# Patient Record
Sex: Male | Born: 1995 | Race: Black or African American | Hispanic: No | Marital: Single | Smoking: Never smoker
Health system: Southern US, Community
[De-identification: ages and names within clinical notes are randomized; demographics above are authoritative.]

---
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
## 2016-01-15 ENCOUNTER — Ambulatory Visit: Payer: Self-pay | Admitting: Family Medicine

## 2016-02-19 ENCOUNTER — Ambulatory Visit: Payer: Self-pay | Admitting: Family Medicine

## 2017-02-11 ENCOUNTER — Encounter: Payer: Self-pay | Admitting: Family Medicine

## 2017-02-11 ENCOUNTER — Ambulatory Visit (INDEPENDENT_AMBULATORY_CARE_PROVIDER_SITE_OTHER): Payer: Managed Care, Other (non HMO) | Admitting: Family Medicine

## 2017-02-11 VITALS — BP 100/70 | HR 78 | Ht 67.0 in | Wt 121.0 lb

## 2017-02-11 DIAGNOSIS — L02413 Cutaneous abscess of right upper limb: Secondary | ICD-10-CM

## 2017-02-11 NOTE — Progress Notes (Signed)
   Subjective:  Robert Hayden is a 21 y.o. male who presents today with a chief complaint of elbow pain and to establish care.   HPI:  Elbow Pain, New problem Symptoms started about 3 weeks ago on his right posterior elbow. Worse with movement. Some swelling and drainage. Started on doxycycline which did not seem to help very much. Doxycycline made him sick to his stomach. Stopped after 5 days. Symptoms are about the same. No fevers or chills  ROS: Per HPI, otherwise a 14 point review of systems was performed and was negative.  PMH:  The following were reviewed and entered/updated in epic: History reviewed. No pertinent past medical history. There are no active problems to display for this patient.  History reviewed. No pertinent surgical history.  History reviewed. No pertinent family history.  Medications- reviewed and updated Current Outpatient Prescriptions  Medication Sig Dispense Refill  . doxycycline (ADOXA) 100 MG tablet Take 100 mg by mouth 2 (two) times daily.     No current facility-administered medications for this visit.     Allergies-reviewed and updated No Known Allergies  Social History   Social History  . Marital status: Single    Spouse name: N/A  . Number of children: N/A  . Years of education: N/A   Social History Main Topics  . Smoking status: Never Smoker  . Smokeless tobacco: Never Used  . Alcohol use No  . Drug use: No  . Sexual activity: Not Asked   Other Topics Concern  . None   Social History Narrative  . None   Objective:  Physical Exam: BP 100/70   Pulse 78   Ht 5\' 7"  (1.702 m)   Wt 121 lb (54.9 kg)   SpO2 99%   BMI 18.95 kg/m   Gen: NAD, resting comfortably CV: RRR with no murmurs appreciated Pulm: NWOB, CTAB with no crackles, wheezes, or rhonchi GI: Normal bowel sounds present. Soft, Nontender, Nondistended. MSK: No edema, cyanosis, or clubbing noted -Rt Elbow: Small well healing scars. No edema or erythema. No areas of  fluctuance.  Skin: Warm, dry Neuro: Grossly normal, moves all extremities Psych: Normal affect and thought content  Assessment/Plan:  Cutaneous Abscess Well healing. No signs of residual infection. Recommended tylenol and/or ibuprofen as needed for pain. Return precautions reviewed.  Preventative Healthcare Flu shot deferred. Will return for CPE.   Katina Degreealeb M. Jimmey RalphParker, MD 02/11/2017 9:53 AM

## 2017-02-11 NOTE — Patient Instructions (Signed)
The spot on your arm is healing normally. You do not need further treatment at this time.   Preventive Care 18-39 Years, Male Preventive care refers to lifestyle choices and visits with your health care provider that can promote health and wellness. What does preventive care include?  A yearly physical exam. This is also called an annual well check.  Dental exams once or twice a year.  Routine eye exams. Ask your health care provider how often you should have your eyes checked.  Personal lifestyle choices, including: ? Daily care of your teeth and gums. ? Regular physical activity. ? Eating a healthy diet. ? Avoiding tobacco and drug use. ? Limiting alcohol use. ? Practicing safe sex. What happens during an annual well check? The services and screenings done by your health care provider during your annual well check will depend on your age, overall health, lifestyle risk factors, and family history of disease. Counseling Your health care provider may ask you questions about your:  Alcohol use.  Tobacco use.  Drug use.  Emotional well-being.  Home and relationship well-being.  Sexual activity.  Eating habits.  Work and work Statistician.  Screening You may have the following tests or measurements:  Height, weight, and BMI.  Blood pressure.  Lipid and cholesterol levels. These may be checked every 5 years starting at age 29.  Diabetes screening. This is done by checking your blood sugar (glucose) after you have not eaten for a while (fasting).  Skin check.  Hepatitis C blood test.  Hepatitis B blood test.  Sexually transmitted disease (STD) testing.  Discuss your test results, treatment options, and if necessary, the need for more tests with your health care provider. Vaccines Your health care provider may recommend certain vaccines, such as:  Influenza vaccine. This is recommended every year.  Tetanus, diphtheria, and acellular pertussis (Tdap, Td)  vaccine. You may need a Td booster every 10 years.  Varicella vaccine. You may need this if you have not been vaccinated.  HPV vaccine. If you are 49 or younger, you may need three doses over 6 months.  Measles, mumps, and rubella (MMR) vaccine. You may need at least one dose of MMR.You may also need a second dose.  Pneumococcal 13-valent conjugate (PCV13) vaccine. You may need this if you have certain conditions and have not been vaccinated.  Pneumococcal polysaccharide (PPSV23) vaccine. You may need one or two doses if you smoke cigarettes or if you have certain conditions.  Meningococcal vaccine. One dose is recommended if you are age 39-21 years and a first-year college student living in a residence hall, or if you have one of several medical conditions. You may also need additional booster doses.  Hepatitis A vaccine. You may need this if you have certain conditions or if you travel or work in places where you may be exposed to hepatitis A.  Hepatitis B vaccine. You may need this if you have certain conditions or if you travel or work in places where you may be exposed to hepatitis B.  Haemophilus influenzae type b (Hib) vaccine. You may need this if you have certain risk factors.  Talk to your health care provider about which screenings and vaccines you need and how often you need them. This information is not intended to replace advice given to you by your health care provider. Make sure you discuss any questions you have with your health care provider. Document Released: 06/04/2001 Document Revised: 12/27/2015 Document Reviewed: 02/07/2015 Elsevier Interactive Patient Education  2017 [employer].

## 2017-07-07 ENCOUNTER — Ambulatory Visit: Payer: Managed Care, Other (non HMO) | Admitting: Family Medicine

## 2017-07-08 ENCOUNTER — Ambulatory Visit: Payer: Managed Care, Other (non HMO) | Admitting: Family Medicine

## 2017-07-09 ENCOUNTER — Encounter: Payer: Self-pay | Admitting: Family Medicine

## 2017-07-09 ENCOUNTER — Ambulatory Visit (INDEPENDENT_AMBULATORY_CARE_PROVIDER_SITE_OTHER): Payer: Managed Care, Other (non HMO) | Admitting: Family Medicine

## 2017-07-09 VITALS — BP 122/68 | HR 80 | Temp 97.5°F | Ht 67.0 in | Wt 123.4 lb

## 2017-07-09 DIAGNOSIS — R51 Headache: Secondary | ICD-10-CM

## 2017-07-09 DIAGNOSIS — R519 Headache, unspecified: Secondary | ICD-10-CM

## 2017-07-09 MED ORDER — SUMATRIPTAN SUCCINATE 25 MG PO TABS: 25.0000 mg | ORAL_TABLET | ORAL | 0 refills | Status: DC | PRN

## 2017-07-09 NOTE — Patient Instructions (Signed)
Please try the imitrex.  If this does not improve your headaches, please let me know.   Please let me know if your symptoms worsen or are not improving.  We may need to get a scan of your head to make sure there is nothing else going on.   Please keep a headache diary.  Come back to see me in 1-2 months or sooner as needed.  Take care, Dr Jimmey RalphParker

## 2017-07-09 NOTE — Progress Notes (Signed)
    Subjective:  Robert Hayden is a 22 y.o. male who presents today for same-day appointment with a chief complaint of headahces.   HPI:  Headaches, Acute Issue Headache started about 2 weeks ago.  Have been stable over the past week or so.  He does not have headaches every day however has the most days of the week.  Pain can be located all over his head.  Usually on one side or the other.  During the headaches has associated photophobia and dizziness.  No weakness or numbness.  No nausea or vomiting.  No obvious precipitating events or aggravating factors, however will sometimes have a headache upon waking up.  He will take Tylenol which usually helps with his headache.  Pain is not worse with cough, sneeze, or bending over.  Headaches are at about a 7-8 at its worst.  No history of head trauma  ROS: Per HPI  PMH: He reports that  has never smoked. he has never used smokeless tobacco. He reports that he does not drink alcohol or use drugs.  Objective:  Physical Exam: BP 122/68 (BP Location: Left Arm, Patient Position: Sitting, Cuff Size: Normal)   Pulse 80   Temp (!) 97.5 F (36.4 C) (Oral)   Ht 5\' 7"  (1.702 m)   Wt 123 lb 6.4 oz (56 kg)   SpO2 97%   BMI 19.33 kg/m   Gen: NAD, resting comfortably CV: RRR with no murmurs appreciated Pulm: NWOB, CTAB with no crackles, wheezes, or rhonchi GI: Normal bowel sounds present. Soft, Nontender, Nondistended. Skin: Warm, dry Neuro: Cranial nerves III through XII intact.  Strength 5 out of 5 in upper and lower extremities.  Sensation to light touch intact throughout.  Reflexes 2+ and symmetric bilaterally at biceps and patella. Psych: Normal affect and thought content  Assessment/Plan:  Headaches Normal neurological exam today.  He is having a little bit of blurred vision, photophobia, and dizziness during his headaches which could be consistent with migraine type aura.  Does not have any other red flag signs or symptoms on today's exam or  history.  Discussed obtaining brain imaging to rule out mass or other intracranial causes today, however patient deferred.  Given his normal neurological exam and lack of red flag signs/symptoms, it is low given his migraine type symptoms, we will empirically treat with sumatriptan.  Advised patient to let us know if symptoms are not improving with sumatriptan.  If no improvement, will likely need brain imaging.  Discussed strict reasons to return to care including worsening of headaches, development of any weakness, numbness, speech difficulty, nausea, vomiting, or syncopal episodes.  Patient will follow-up with me in 3-4 weeks.  Advised him to keep a headache diary to look for any specific triggers.  He will also be seeing an eye doctor to have his vision checked.  Katina Degreealeb M. Jimmey RalphParker, MD 07/09/2017 10:21 AM

## 2017-10-28 ENCOUNTER — Ambulatory Visit (INDEPENDENT_AMBULATORY_CARE_PROVIDER_SITE_OTHER): Payer: Managed Care, Other (non HMO) | Admitting: Family Medicine

## 2017-10-28 ENCOUNTER — Encounter: Payer: Self-pay | Admitting: Family Medicine

## 2017-10-28 VITALS — BP 118/64 | HR 70 | Temp 98.5°F | Ht 67.0 in | Wt 119.2 lb

## 2017-10-28 DIAGNOSIS — H6693 Otitis media, unspecified, bilateral: Secondary | ICD-10-CM | POA: Diagnosis not present

## 2017-10-28 MED ORDER — AMOXICILLIN 875 MG PO TABS: 875.0000 mg | ORAL_TABLET | Freq: Two times a day (BID) | ORAL | 0 refills | Status: DC

## 2017-10-28 MED ORDER — KETOROLAC TROMETHAMINE 60 MG/2ML IM SOLN
60.0000 mg | Freq: Once | INTRAMUSCULAR | Status: AC
  Administered 2017-10-28: 60 mg via INTRAMUSCULAR

## 2017-10-28 NOTE — Patient Instructions (Addendum)
It was very nice to see you today!  You have an ear infection. Please start the antibiotic.  We will give you an injection of a pain medication today called Toradol.  This will relieve your inflammation.  Starting tomorrow, you can take Motrin or naproxen as needed.  Please let me know if your symptoms worsen or do not improve over the next few days.  Take care, Dr Jimmey RalphParker   Otitis Media, Adult Otitis media is redness, soreness, and puffiness (swelling) in the space just behind your eardrum (middle ear). It may be caused by allergies or infection. It often happens along with a cold. Follow these instructions at home:  Take your medicine as told. Finish it even if you start to feel better.  Only take over-the-counter or prescription medicines for pain, discomfort, or fever as told by your doctor.  Follow up with your doctor as told. Contact a doctor if:  You have otitis media only in one ear, or bleeding from your nose, or both.  You notice a lump on your neck.  You are not getting better in 3-5 days.  You feel worse instead of better. Get help right away if:  You have pain that is not helped with medicine.  You have puffiness, redness, or pain around your ear.  You get a stiff neck.  You cannot move part of your face (paralysis).  You notice that the bone behind your ear hurts when you touch it. This information is not intended to replace advice given to you by your health care provider. Make sure you discuss any questions you have with your health care provider. Document Released: 09/25/2007 Document Revised: 09/14/2015 Document Reviewed: 11/03/2012 Elsevier Interactive Patient Education  2017 [employer]

## 2017-10-28 NOTE — Progress Notes (Signed)
   Subjective:  Robert Hayden is a 22 y.o. male who presents today for same-day appointment with a chief complaint of ear pain.   HPI:  Ear Pain, Acute problem Started about a week ago.  Initially associated with some rhinorrhea and sore throat which has since improved.  He recently went to [place].  Symptoms have worsened over the last couple of days.  He saw a physician in [place] and was given a medication, but does not remember the name of the medication.  No obvious alleviating or aggravating factors.  ROS: Per HPI  PMH: He reports that he has never smoked. He has never used smokeless tobacco. He reports that he does not drink alcohol or use drugs.  Objective:  Physical Exam: BP 118/64 (BP Location: Left Arm, Patient Position: Sitting, Cuff Size: Normal)   Pulse 70   Temp 98.5 F (36.9 C) (Oral)   Ht 5\' 7"  (1.702 m)   Wt 119 lb 3.2 oz (54.1 kg)   SpO2 98%   BMI 18.67 kg/m   Gen: NAD, resting comfortably HEENT: Bilateral TMs erythematous and bulging.  Oropharynx slightly erythematous without exudate.  Mild tonsillar hypertrophy noted bilaterally. CV: RRR with no murmurs appreciated Pulm: NWOB, CTAB with no crackles, wheezes, or rhonchi  Assessment/Plan:  Bilateral otitis media Start amoxicillin 875 mg twice daily for 7 days.  Will give 60 mg IM Toradol today.  Recommend use of over-the-counter analgesics starting tomorrow.  Encouraged good oral hydration.  Discussed reasons to return to care.  Follow-up as needed.  Katina Degreealeb M. Jimmey RalphParker, MD 10/28/2017 1:10 PM

## 2018-01-29 ENCOUNTER — Ambulatory Visit (INDEPENDENT_AMBULATORY_CARE_PROVIDER_SITE_OTHER): Payer: Managed Care, Other (non HMO) | Admitting: Family Medicine

## 2018-01-29 ENCOUNTER — Ambulatory Visit (INDEPENDENT_AMBULATORY_CARE_PROVIDER_SITE_OTHER): Payer: Managed Care, Other (non HMO)

## 2018-01-29 ENCOUNTER — Encounter: Payer: Self-pay | Admitting: Family Medicine

## 2018-01-29 VITALS — BP 116/62 | HR 67 | Temp 98.3°F | Ht 67.0 in | Wt 119.2 lb

## 2018-01-29 DIAGNOSIS — M545 Low back pain, unspecified: Secondary | ICD-10-CM

## 2018-01-29 MED ORDER — DICLOFENAC SODIUM 75 MG PO TBEC: 75.0000 mg | DELAYED_RELEASE_TABLET | Freq: Two times a day (BID) | ORAL | 0 refills | Status: DC

## 2018-01-29 MED ORDER — CYCLOBENZAPRINE HCL 10 MG PO TABS: 10.0000 mg | ORAL_TABLET | Freq: Three times a day (TID) | ORAL | 0 refills | Status: DC | PRN
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
## 2018-01-29 NOTE — Patient Instructions (Signed)
It was very nice to see you today!  Please start the diclofenac and flexeril. Let me know if your pain and mood symptoms doe not improve over the next 1-2 weeks, or if you experience any worsening of your symptoms.  We will call you with the xray results once they are available.   Take care, Dr Jimmey Ralph

## 2018-01-30 NOTE — Progress Notes (Signed)
Please inform patient of the following:  His xray was NORMAL.   He should let us know if his symptoms do not improve over the next 1-2 weeks.  Katina Degree. Jimmey Ralph, MD 01/30/2018 8:01 AM

## 2018-02-11 ENCOUNTER — Encounter: Payer: Managed Care, Other (non HMO) | Admitting: Family Medicine

## 2018-02-25 ENCOUNTER — Encounter: Payer: Self-pay | Admitting: Family Medicine

## 2018-02-25 ENCOUNTER — Ambulatory Visit (INDEPENDENT_AMBULATORY_CARE_PROVIDER_SITE_OTHER): Payer: Managed Care, Other (non HMO) | Admitting: Family Medicine

## 2018-02-25 VITALS — BP 122/74 | HR 80 | Temp 98.0°F | Ht 68.0 in | Wt 120.4 lb

## 2018-02-25 DIAGNOSIS — Z0001 Encounter for general adult medical examination with abnormal findings: Secondary | ICD-10-CM

## 2018-02-25 DIAGNOSIS — M545 Low back pain, unspecified: Secondary | ICD-10-CM

## 2018-02-25 DIAGNOSIS — F4322 Adjustment disorder with anxiety: Secondary | ICD-10-CM

## 2018-02-25 NOTE — Patient Instructions (Addendum)
It was very nice to see you today!  Please see Dr Paulla Fore soon for your back.  Please see Lattie Haw for your anxiety.  Please try increasing your protein intake to help with your weight gain.  Otherwise, keep up the good work!  Come back to see me 1 year, or sooner as needed  Take care, Dr Jerline Pain   Preventive Care 18-39 Years, Male Preventive care refers to lifestyle choices and visits with your health care provider that can promote health and wellness. What does preventive care include?  A yearly physical exam. This is also called an annual well check.  Dental exams once or twice a year.  Routine eye exams. Ask your health care provider how often you should have your eyes checked.  Personal lifestyle choices, including: ? Daily care of your teeth and gums. ? Regular physical activity. ? Eating a healthy diet. ? Avoiding tobacco and drug use. ? Limiting alcohol use. ? Practicing safe sex. What happens during an annual well check? The services and screenings done by your health care provider during your annual well check will depend on your age, overall health, lifestyle risk factors, and family history of disease. Counseling Your health care provider may ask you questions about your:  Alcohol use.  Tobacco use.  Drug use.  Emotional well-being.  Home and relationship well-being.  Sexual activity.  Eating habits.  Work and work Statistician.  Screening You may have the following tests or measurements:  Height, weight, and BMI.  Blood pressure.  Lipid and cholesterol levels. These may be checked every 5 years starting at age 88.  Diabetes screening. This is done by checking your blood sugar (glucose) after you have not eaten for a while (fasting).  Skin check.  Hepatitis C blood test.  Hepatitis B blood test.  Sexually transmitted disease (STD) testing.  Discuss your test results, treatment options, and if necessary, the need for more tests with your  health care provider. Vaccines Your health care provider may recommend certain vaccines, such as:  Influenza vaccine. This is recommended every year.  Tetanus, diphtheria, and acellular pertussis (Tdap, Td) vaccine. You may need a Td booster every 10 years.  Varicella vaccine. You may need this if you have not been vaccinated.  HPV vaccine. If you are 58 or younger, you may need three doses over 6 months.  Measles, mumps, and rubella (MMR) vaccine. You may need at least one dose of MMR.You may also need a second dose.  Pneumococcal 13-valent conjugate (PCV13) vaccine. You may need this if you have certain conditions and have not been vaccinated.  Pneumococcal polysaccharide (PPSV23) vaccine. You may need one or two doses if you smoke cigarettes or if you have certain conditions.  Meningococcal vaccine. One dose is recommended if you are age 11-21 years and a first-year college student living in a residence hall, or if you have one of several medical conditions. You may also need additional booster doses.  Hepatitis A vaccine. You may need this if you have certain conditions or if you travel or work in places where you may be exposed to hepatitis A.  Hepatitis B vaccine. You may need this if you have certain conditions or if you travel or work in places where you may be exposed to hepatitis B.  Haemophilus influenzae type b (Hib) vaccine. You may need this if you have certain risk factors.  Talk to your health care provider about which screenings and vaccines you need and how often you  need them. This information is not intended to replace advice given to you by your health care provider. Make sure you discuss any questions you have with your health care provider. Document Released: 06/04/2001 Document Revised: 12/27/2015 Document Reviewed: 02/07/2015 Elsevier Interactive Patient Education  [employer].
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
## 2018-03-12 ENCOUNTER — Encounter: Payer: Self-pay | Admitting: Family Medicine

## 2018-03-12 ENCOUNTER — Ambulatory Visit (INDEPENDENT_AMBULATORY_CARE_PROVIDER_SITE_OTHER): Payer: Managed Care, Other (non HMO) | Admitting: Family Medicine

## 2018-03-12 VITALS — BP 118/72 | HR 74 | Temp 98.3°F | Ht 68.0 in | Wt 119.2 lb

## 2018-03-12 DIAGNOSIS — H547 Unspecified visual loss: Secondary | ICD-10-CM

## 2018-03-12 DIAGNOSIS — H5711 Ocular pain, right eye: Secondary | ICD-10-CM | POA: Diagnosis not present

## 2018-03-12 MED ORDER — KETOROLAC TROMETHAMINE 60 MG/2ML IM SOLN
60.0000 mg | Freq: Once | INTRAMUSCULAR | Status: AC
  Administered 2018-03-12: 60 mg via INTRAMUSCULAR

## 2018-03-12 NOTE — Progress Notes (Signed)
   Subjective:  Robert Hayden is a 22 y.o. male who presents today for same-day appointment with a chief complaint of blurred vision.   HPI:  Blurred Vision, Acute problem Patient has had several year history of decreased visual acuity, however noticed worsening blurred vision a few days ago.  Symptoms are stable over that time.     Located in bilateral eyes, however worse in his right.  No obvious precipitating events.  Symptoms occurred randomly.  No nausea or vomiting.  No weakness or numbness.  He has some throbbing pain in his right eye as well.  Pain is worsened with bright lights.  He has not tried any specific treatments.  He has never seen an eye doctor in the past.  No obvious alleviating or aggravating factors.  ROS: Per HPI  PMH: He reports that he has never smoked. He has never used smokeless tobacco. He reports that he does not drink alcohol or use drugs.  Objective:  Physical Exam: BP 118/72 (BP Location: Left Arm, Patient Position: Sitting, Cuff Size: Normal)   Pulse 74   Temp 98.3 F (36.8 C) (Oral)   Ht 5\' 8"  (1.727 m)   Wt 119 lb 3.2 oz (54.1 kg)   SpO2 99%   BMI 18.12 kg/m   Gen: NAD, resting comfortably HEENT: -Eyes: No deformities.  Pupils equally round and reactive to light and accommodation.  Extraocular eye movements intact without pain.  Funduscopic exam within normal limits. Neuro: Cranial nerves III through XII intact.  Strength 5 out of 5 in upper and lower extremities. Psych: Normal affect and thought content  Visual Acuity:  Right eye 20/100 Left eye 20/50 Both 20/60   Assessment/Plan:  Decreased visual acuity/right eye pain Patient's exam is essentially normal aside from his decreased visual acuity.  His physical exam is without red flags.  Differential at this point includes decreased visual acuity with eye strain versus retinal migraine.  He has no signs of glaucoma, retinal detachment, trauma, or any other condition that needs emergent  evaluation.  We will give 60 mg of IM Toradol today to treat potential underlying migraine component.  We will also place referral to ophthalmology for further testing and management for his decreased visual acuity.  Discussed reasons to return to care and seek emergent care.  Katina Degreealeb M. Jimmey RalphParker, MD 03/12/2018 11:49 AM

## 2018-03-12 NOTE — Patient Instructions (Signed)
It was very nice to see you today!  It is possible that you could be having a migraine.  We will give you an injection of the migraine medication today called Toradol.  This may take a few hours to take full effect.  You can also try taking a Benadryl when you get home.  Please let me know if this improves or does not improve your symptoms.  I would also like for you to see an eye doctor soon.  I will place a referral for this.  Please seek medical care if you have worsening vision, headache, or any other changes in your symptoms.  Take care, Dr Jimmey RalphParker

## 2018-11-05 DIAGNOSIS — F411 Generalized anxiety disorder: Secondary | ICD-10-CM | POA: Diagnosis not present

## 2018-11-24 ENCOUNTER — Ambulatory Visit: Payer: Managed Care, Other (non HMO) | Admitting: Family Medicine

## 2018-11-27 ENCOUNTER — Ambulatory Visit (INDEPENDENT_AMBULATORY_CARE_PROVIDER_SITE_OTHER): Payer: Self-pay | Admitting: Family Medicine

## 2018-11-27 ENCOUNTER — Other Ambulatory Visit: Payer: Self-pay

## 2018-11-27 ENCOUNTER — Other Ambulatory Visit (HOSPITAL_COMMUNITY)
Admission: RE | Admit: 2018-11-27 | Discharge: 2018-11-27 | Disposition: A | Discharge status: Home / Self Care | Payer: Managed Care, Other (non HMO) | Source: Ambulatory Visit | Attending: Family Medicine | Admitting: Family Medicine

## 2018-11-27 ENCOUNTER — Encounter: Payer: Self-pay | Admitting: Family Medicine

## 2018-11-27 VITALS — BP 98/64 | HR 73 | Temp 98.4°F | Ht 67.25 in | Wt 116.2 lb

## 2018-11-27 DIAGNOSIS — Z113 Encounter for screening for infections with a predominantly sexual mode of transmission: Secondary | ICD-10-CM | POA: Insufficient documentation

## 2018-11-27 DIAGNOSIS — Z0001 Encounter for general adult medical examination with abnormal findings: Secondary | ICD-10-CM | POA: Diagnosis present

## 2018-11-27 DIAGNOSIS — F411 Generalized anxiety disorder: Secondary | ICD-10-CM | POA: Diagnosis not present

## 2018-11-27 DIAGNOSIS — F320 Major depressive disorder, single episode, mild: Secondary | ICD-10-CM | POA: Insufficient documentation

## 2018-11-27 DIAGNOSIS — G43809 Other migraine, not intractable, without status migrainosus: Secondary | ICD-10-CM

## 2018-11-27 DIAGNOSIS — F321 Major depressive disorder, single episode, moderate: Secondary | ICD-10-CM

## 2018-11-27 DIAGNOSIS — R519 Headache, unspecified: Secondary | ICD-10-CM

## 2018-11-27 DIAGNOSIS — R51 Headache: Secondary | ICD-10-CM

## 2018-11-27 DIAGNOSIS — G43909 Migraine, unspecified, not intractable, without status migrainosus: Secondary | ICD-10-CM | POA: Insufficient documentation

## 2018-11-27 DIAGNOSIS — F419 Anxiety disorder, unspecified: Secondary | ICD-10-CM | POA: Insufficient documentation

## 2018-11-27 MED ORDER — DICLOFENAC SODIUM 75 MG PO TBEC: 75.0000 mg | DELAYED_RELEASE_TABLET | Freq: Two times a day (BID) | ORAL | 0 refills | Status: DC

## 2018-11-27 NOTE — Assessment & Plan Note (Signed)
Discussed role of medications however patient time.  He will continue seeing his therapist.  Briefly discussed mindfulness techniques.

## 2018-11-27 NOTE — Progress Notes (Signed)
wor   Chief Complaint:  Robert Hayden is a 23 y.o. male who presents today for his annual comprehensive physical exam.    Assessment/Plan:  Migraines No red flags.  Did not respond well to sumatriptan.  Will start diclofenac 75 mg twice daily as needed.  Discussed prophylactic medication such as amitriptyline however patient declined.   Depression, major, single episode, mild (HCC) Discussed role of medications however patient time.  He will continue seeing his therapist.  Briefly discussed mindfulness techniques.  Anxiety Does not want to start medications.  He will continue seeing therapy.  Discussed mindfulness.  Body mass index is 18.07 kg/m. / Underweight BMI Metric Follow Up - 11/27/18 1455      BMI Metric Follow Up-Please document annually   BMI Metric Follow Up  Education provided       Preventative Healthcare: Check for STDs today. Flu vaccine later this year.   Patient Counseling(The following topics were reviewed and/or handout was given):  -Nutrition: Stressed importance of moderation in sodium/caffeine intake, saturated fat and cholesterol, caloric balance, sufficient intake of fresh fruits, vegetables, and fiber.  -Stressed the importance of regular exercise.   -Substance Abuse: Discussed cessation/primary prevention of tobacco, alcohol, or other drug use; driving or other dangerous activities under the influence; availability of treatment for abuse.   -Injury prevention: Discussed safety belts, safety helmets, smoke detector, smoking near bedding or upholstery.   -Sexuality: Discussed sexually transmitted diseases, partner selection, use of condoms, avoidance of unintended pregnancy and contraceptive alternatives.   -Dental health: Discussed importance of regular tooth brushing, flossing, and dental visits.  -Health maintenance and immunizations reviewed. Please refer to Health maintenance section.  Return to care in 1 year for next preventative visit.      Subjective:  HPI:  He has no acute complaints today.   He has had a nagging migraine for the last week or so. Has tried taking tylenol which has not helped. He took a dose of Tylenol which helped for a day however symptoms then returned.  He had to go home from work early due to the headache couple of days ago.  He has noticed some increased blurred vision.  No weakness or numbness.  No nausea or vomiting.  Also some increased fatigue over the last few days.  Thinks that he has been drinking a good amount of fluids and has been sleeping well at night.  Current headache is similar to past migraines.  Patient thinks that he gets migraines about once per month.  Has tried sumatriptan in the past did not work.  Anxiety/depression Patient currently seeing a psychologist to help with anxiety and depression.  Thinks this is going well.  Does not want to start any medications today.   Depression screen PHQ 2/9 11/27/2018  Decreased Interest 2  Down, Depressed, Hopeless 2  PHQ - 2 Score 4  Altered sleeping 0  Tired, decreased energy 3  Change in appetite 0  Feeling bad or failure about yourself  3  Trouble concentrating 3  Moving slowly or fidgety/restless 2  Suicidal thoughts 0  PHQ-9 Score 15  Difficult doing work/chores Somewhat difficult    GAD 7 : Generalized Anxiety Score 11/27/2018  Nervous, Anxious, on Edge 2  Control/stop worrying 2  Worry too much - different things 3  Trouble relaxing 0  Restless 0  Easily annoyed or irritable 2  Afraid - awful might happen 3  Total GAD 7 Score 12  Anxiety Difficulty Somewhat difficult  Patient would like to gain some weight.  He notes that he eats unhealthy and needs frequent fried foods and fast foods.  He is interested in trying a healthy diet to facilitate weight gain.  He does not have body image issues and has no aversion to food.  Has never engaged in any restrictive or purging-like behavior.  He would like to get tested for STDs  today.   Lifestyle Diet: Trying to eat a healthy, balanced diet.  Exercise: Limited due to pandemic.  Depression screen PHQ 2/9 11/27/2018  Decreased Interest 2  Down, Depressed, Hopeless 2  PHQ - 2 Score 4  Altered sleeping 0  Tired, decreased energy 3  Change in appetite 0  Feeling bad or failure about yourself  3  Trouble concentrating 3  Moving slowly or fidgety/restless 2  Suicidal thoughts 0  PHQ-9 Score 15  Difficult doing work/chores Somewhat difficult    Health Maintenance Due  Topic Date Due  . HIV Screening  05/15/2011  . INFLUENZA VACCINE  11/21/2018     ROS: Per HPI, otherwise a complete review of systems was negative.   PMH:  The following were reviewed and entered/updated in epic: History reviewed. No pertinent past medical history. Patient Active Problem List   Diagnosis Date Noted  . Migraines 11/27/2018  . Depression, major, single episode, mild (HCC) 11/27/2018  . Anxiety 11/27/2018   History reviewed. No pertinent surgical history.  History reviewed. No pertinent family history.  Medications- reviewed and updated Current Outpatient Medications  Medication Sig Dispense Refill  . diclofenac (VOLTAREN) 75 MG EC tablet Take 1 tablet (75 mg total) by mouth 2 (two) times daily. 30 tablet 0   No current facility-administered medications for this visit.     Allergies-reviewed and updated No Known Allergies  Social History   Socioeconomic History  . Marital status: Single    Spouse name: Not on file  . Number of children: Not on file  . Years of education: Not on file  . Highest education level: Not on file  Occupational History  . Not on file  Social Needs  . Financial resource strain: Not on file  . Food insecurity    Worry: Not on file    Inability: Not on file  . Transportation needs    Medical: Not on file    Non-medical: Not on file  Tobacco Use  . Smoking status: Never Smoker  . Smokeless tobacco: Never Used  Substance and  Sexual Activity  . Alcohol use: No  . Drug use: No  . Sexual activity: Not on file  Lifestyle  . Physical activity    Days per week: Not on file    Minutes per session: Not on file  . Stress: Not on file  Relationships  . Social Musicianconnections    Talks on phone: Not on file    Gets together: Not on file    Attends religious service: Not on file    Active member of club or organization: Not on file    Attends meetings of clubs or organizations: Not on file    Relationship status: Not on file  Other Topics Concern  . Not on file  Social History Narrative  . Not on file        Objective:  Physical Exam: BP 98/64 (BP Location: Left Arm)   Pulse 73   Temp 98.4 F (36.9 C)   Ht 5' 7.25" (1.708 m)   Wt 116 lb 4 oz (52.7 kg)  SpO2 98%   BMI 18.07 kg/m   Body mass index is 18.07 kg/m. Wt Readings from Last 3 Encounters:  11/27/18 116 lb 4 oz (52.7 kg)  03/12/18 119 lb 3.2 oz (54.1 kg)  02/25/18 120 lb 6.4 oz (54.6 kg)   Gen: NAD, resting comfortably HEENT: TMs normal bilaterally. OP clear. No thyromegaly noted.  CV: RRR with no murmurs appreciated Pulm: NWOB, CTAB with no crackles, wheezes, or rhonchi GI: Normal bowel sounds present. Soft, Nontender, Nondistended. MSK: no edema, cyanosis, or clubbing noted Skin: warm, dry Neuro: CN2-12 grossly intact. Strength 5/5 in upper and lower extremities. Reflexes symmetric and intact bilaterally.  Psych: Normal affect and thought content     Emerson Barretto M. Jerline Pain, MD 11/27/2018 4:30 PM

## 2018-11-27 NOTE — Assessment & Plan Note (Addendum)
Does not want to start medications.  He will continue seeing therapy.  Discussed mindfulness.

## 2018-11-27 NOTE — Patient Instructions (Signed)
It was very nice to see you today!  Please try the diclofenac for your headaches.  See the nutritionist soon. Take ensure shakes once or twice daily  Please get your eyes checked soon.   Let me know if you want to try and anxiety or depression medication.   Come back in 1 year for your next physical or sooner as needed.   Take care, Dr Jerline Pain  Please try these tips to maintain a healthy lifestyle:   Eat at least 3 REAL meals and 1-2 snacks per day.  Aim for no more than 5 hours between eating.  If you eat breakfast, please do so within one hour of getting up.    Obtain twice as many fruits/vegetables as protein or carbohydrate foods for both lunch and dinner. (Half of each meal should be fruits/vegetables, one quarter protein, and one quarter starchy carbs)   Cut down on sweet beverages. This includes juice, soda, and sweet tea.    Exercise at least 150 minutes every week.    Preventive Care 42-15 Years Old, Male Preventive care refers to lifestyle choices and visits with your health care provider that can promote health and wellness. This includes:  A yearly physical exam. This is also called an annual well check.  Regular dental and eye exams.  Immunizations.  Screening for certain conditions.  Healthy lifestyle choices, such as eating a healthy diet, getting regular exercise, not using drugs or products that contain nicotine and tobacco, and limiting alcohol use. What can I expect for my preventive care visit? Physical exam Your health care provider will check:  Height and weight. These may be used to calculate body mass index (BMI), which is a measurement that tells if you are at a healthy weight.  Heart rate and blood pressure.  Your skin for abnormal spots. Counseling Your health care provider may ask you questions about:  Alcohol, tobacco, and drug use.  Emotional well-being.  Home and relationship well-being.  Sexual activity.  Eating habits.   Work and work Statistician. What immunizations do I need?  Influenza (flu) vaccine  This is recommended every year. Tetanus, diphtheria, and pertussis (Tdap) vaccine  You may need a Td booster every 10 years. Varicella (chickenpox) vaccine  You may need this vaccine if you have not already been vaccinated. Human papillomavirus (HPV) vaccine  If recommended by your health care provider, you may need three doses over 6 months. Measles, mumps, and rubella (MMR) vaccine  You may need at least one dose of MMR. You may also need a second dose. Meningococcal conjugate (MenACWY) vaccine  One dose is recommended if you are 17-31 years old and a Market researcher living in a residence hall, or if you have one of several medical conditions. You may also need additional booster doses. Pneumococcal conjugate (PCV13) vaccine  You may need this if you have certain conditions and were not previously vaccinated. Pneumococcal polysaccharide (PPSV23) vaccine  You may need one or two doses if you smoke cigarettes or if you have certain conditions. Hepatitis A vaccine  You may need this if you have certain conditions or if you travel or work in places where you may be exposed to hepatitis A. Hepatitis B vaccine  You may need this if you have certain conditions or if you travel or work in places where you may be exposed to hepatitis B. Haemophilus influenzae type b (Hib) vaccine  You may need this if you have certain risk factors. You may receive  vaccines as individual doses or as more than one vaccine together in one shot (combination vaccines). Talk with your health care provider about the risks and benefits of combination vaccines. What tests do I need? Blood tests  Lipid and cholesterol levels. These may be checked every 5 years starting at age 46.  Hepatitis C test.  Hepatitis B test. Screening   Diabetes screening. This is done by checking your blood sugar (glucose) after you  have not eaten for a while (fasting).  Sexually transmitted disease (STD) testing. Talk with your health care provider about your test results, treatment options, and if necessary, the need for more tests. Follow these instructions at home: Eating and drinking   Eat a diet that includes fresh fruits and vegetables, whole grains, lean protein, and low-fat dairy products.  Take vitamin and mineral supplements as recommended by your health care provider.  Do not drink alcohol if your health care provider tells you not to drink.  If you drink alcohol: ? Limit how much you have to 0-2 drinks a day. ? Be aware of how much alcohol is in your drink. In the [place] one drink equals one 12 oz bottle of beer (355 mL), one 5 oz glass of wine (148 mL), or one 1 oz glass of hard liquor (44 mL). Lifestyle  Take daily care of your teeth and gums.  Stay active. Exercise for at least 30 minutes on 5 or more days each week.  Do not use any products that contain nicotine or tobacco, such as cigarettes, e-cigarettes, and chewing tobacco. If you need help quitting, ask your health care provider.  If you are sexually active, practice safe sex. Use a condom or other form of protection to prevent STIs (sexually transmitted infections). What's next?  Go to your health care provider once a year for a well check visit.  Ask your health care provider how often you should have your eyes and teeth checked.  Stay up to date on all vaccines. This information is not intended to replace advice given to you by your health care provider. Make sure you discuss any questions you have with your health care provider. Document Released: 06/04/2001 Document Revised: 04/02/2018 Document Reviewed: 04/02/2018 Elsevier Patient Education  2020 [employer].

## 2018-11-27 NOTE — Assessment & Plan Note (Signed)
No red flags.  Did not respond well to sumatriptan.  Will start diclofenac 75 mg twice daily as needed.  Discussed prophylactic medication such as amitriptyline however patient declined.

## 2018-12-01 LAB — URINE CYTOLOGY ANCILLARY ONLY
Chlamydia: NEGATIVE
Neisseria Gonorrhea: NEGATIVE
Trichomonas: NEGATIVE

## 2018-12-01 NOTE — Progress Notes (Signed)
Please inform patient of the following:  STD test is NORMAL - No signs of any stds.  Robert Hayden. Jerline Pain, MD 12/01/2018 4:18 PM

## 2019-01-20 DIAGNOSIS — F411 Generalized anxiety disorder: Secondary | ICD-10-CM | POA: Diagnosis not present

## 2019-02-05 DIAGNOSIS — F411 Generalized anxiety disorder: Secondary | ICD-10-CM | POA: Diagnosis not present

## 2019-03-05 DIAGNOSIS — F411 Generalized anxiety disorder: Secondary | ICD-10-CM | POA: Diagnosis not present

## 2019-03-26 DIAGNOSIS — F411 Generalized anxiety disorder: Secondary | ICD-10-CM | POA: Diagnosis not present

## 2019-04-09 DIAGNOSIS — F411 Generalized anxiety disorder: Secondary | ICD-10-CM | POA: Diagnosis not present

## 2019-04-30 DIAGNOSIS — F411 Generalized anxiety disorder: Secondary | ICD-10-CM | POA: Diagnosis not present

## 2019-06-10 DIAGNOSIS — F411 Generalized anxiety disorder: Secondary | ICD-10-CM | POA: Diagnosis not present

## 2019-07-29 DIAGNOSIS — F411 Generalized anxiety disorder: Secondary | ICD-10-CM | POA: Diagnosis not present

## 2020-01-18 DIAGNOSIS — B351 Tinea unguium: Secondary | ICD-10-CM | POA: Diagnosis not present

## 2020-01-18 DIAGNOSIS — M79674 Pain in right toe(s): Secondary | ICD-10-CM | POA: Diagnosis not present

## 2020-01-18 DIAGNOSIS — M79675 Pain in left toe(s): Secondary | ICD-10-CM | POA: Diagnosis not present

## 2020-02-18 ENCOUNTER — Encounter: Payer: Self-pay | Admitting: Family Medicine

## 2020-02-18 ENCOUNTER — Other Ambulatory Visit (HOSPITAL_COMMUNITY)
Admission: RE | Admit: 2020-02-18 | Discharge: 2020-02-18 | Disposition: A | Discharge status: Home / Self Care | Payer: BC Managed Care – PPO | Source: Ambulatory Visit | Attending: Family Medicine | Admitting: Family Medicine

## 2020-02-18 ENCOUNTER — Other Ambulatory Visit: Payer: Self-pay

## 2020-02-18 ENCOUNTER — Ambulatory Visit (INDEPENDENT_AMBULATORY_CARE_PROVIDER_SITE_OTHER): Payer: BC Managed Care – PPO | Admitting: Family Medicine

## 2020-02-18 VITALS — BP 97/62 | HR 80 | Temp 98.8°F | Ht 67.25 in | Wt 126.0 lb

## 2020-02-18 DIAGNOSIS — Z13220 Encounter for screening for lipoid disorders: Secondary | ICD-10-CM

## 2020-02-18 DIAGNOSIS — Z0001 Encounter for general adult medical examination with abnormal findings: Secondary | ICD-10-CM | POA: Diagnosis not present

## 2020-02-18 DIAGNOSIS — Z113 Encounter for screening for infections with a predominantly sexual mode of transmission: Secondary | ICD-10-CM | POA: Diagnosis not present

## 2020-02-18 DIAGNOSIS — F320 Major depressive disorder, single episode, mild: Secondary | ICD-10-CM

## 2020-02-18 DIAGNOSIS — F419 Anxiety disorder, unspecified: Secondary | ICD-10-CM | POA: Diagnosis not present

## 2020-02-18 LAB — CBC: MCH: 27.7 pg (ref 27.0–33.0)

## 2020-02-18 MED ORDER — CITALOPRAM HYDROBROMIDE 20 MG PO TABS: 20.0000 mg | ORAL_TABLET | Freq: Every day | ORAL | 3 refills | Status: DC

## 2020-02-18 NOTE — Assessment & Plan Note (Signed)
Discussed different treatment options.  We will start Celexa 20 mg daily.  Discussed potential side effects.  He will check with me in a few weeks via MyChart.

## 2020-02-18 NOTE — Progress Notes (Signed)
Chief Complaint:  Robert Hayden is a 24 y.o. male who presents today for his annual comprehensive physical exam.    Assessment/Plan:  New/Acute Problems: Dizziness No red flags.  Symptoms seem to be more consistent with follow-up with racing thoughts/panic attacks than dizziness.  Normal exam today.  Will check labs including CBC, CMET, TSH.  Hopefully will have some improvement as we treat his anxiety and depression.  Chronic Problems Addressed Today: Anxiety Discussed different treatment options.  We will start Celexa 20 mg daily.  Discussed potential side effects.  He will check with me in a few weeks via MyChart.  Depression, major, single episode, mild (HCC) Discussed options.  We will start Celexa as above.  Follow-up in few weeks via MyChart.  Preventative Healthcare: We will screen for STDs.  Check CBC, CMET, TSH, lipid panel.  Flu vaccine deferred.  Patient Counseling(The following topics were reviewed and/or handout was given):  -Nutrition: Stressed importance of moderation in sodium/caffeine intake, saturated fat and cholesterol, caloric balance, sufficient intake of fresh fruits, vegetables, and fiber.  -Stressed the importance of regular exercise.   -Substance Abuse: Discussed cessation/primary prevention of tobacco, alcohol, or other drug use; driving or other dangerous activities under the influence; availability of treatment for abuse.   -Injury prevention: Discussed safety belts, safety helmets, smoke detector, smoking near bedding or upholstery.   -Sexuality: Discussed sexually transmitted diseases, partner selection, use of condoms, avoidance of unintended pregnancy and contraceptive alternatives.   -Dental health: Discussed importance of regular tooth brushing, flossing, and dental visits.  -Health maintenance and immunizations reviewed. Please refer to Health maintenance section.  Return to care in 1 year for next preventative visit.     Subjective:   HPI:  Patient has had several issues over the last 2 months like discuss today.  Has been having more depression anxiety.  Her grandmother recently passed away and she was also been difficult for him.  Currently lives in [place] by himself.  He has a few friends but does not spend very much time with him.  He has not had as much interest in doing hobbies as he did previously.  He has had several episodes of racing thoughts and feeling lightheaded.  No syncope or loss of consciousness.  No weakness or numbness.  He has had depression and anxiety issues for several years.  In the past he was seeing a counselor but is no longer seeing anybody.  He is also having more fatigue and lethargy.  Lifestyle Diet: Balanced. Trying cut down on juice.  Exercise: Trying to exercise more.   Depression screen PHQ 2/9 02/18/2020  Decreased Interest 3  Down, Depressed, Hopeless 3  PHQ - 2 Score 6  Altered sleeping 3  Tired, decreased energy 3  Change in appetite 3  Feeling bad or failure about yourself  3  Trouble concentrating 3  Moving slowly or fidgety/restless 3  Suicidal thoughts 0  PHQ-9 Score 24  Difficult doing work/chores Very difficult    Health Maintenance Due  Topic Date Due  . Hepatitis C Screening  Never done  . COVID-19 Vaccine (1) Never done  . HIV Screening  Never done     ROS: Per HPI, otherwise a complete review of systems was negative.   PMH:  The following were reviewed and entered/updated in epic: History reviewed. No pertinent past medical history. Patient Active Problem List   Diagnosis Date Noted  . Migraines 11/27/2018  . Depression, major, single episode, mild (HCC) 11/27/2018  .  Anxiety 11/27/2018   History reviewed. No pertinent surgical history.  History reviewed. No pertinent family history.  Medications- reviewed and updated Current Outpatient Medications  Medication Sig Dispense Refill  . citalopram (CELEXA) 20 MG tablet Take 1 tablet (20 mg total)  by mouth daily. 30 tablet 3   No current facility-administered medications for this visit.    Allergies-reviewed and updated No Known Allergies  Social History   Socioeconomic History  . Marital status: Single    Spouse name: Not on file  . Number of children: Not on file  . Years of education: Not on file  . Highest education level: Not on file  Occupational History  . Not on file  Tobacco Use  . Smoking status: Never Smoker  . Smokeless tobacco: Never Used  Vaping Use  . Vaping Use: Never used  Substance and Sexual Activity  . Alcohol use: No  . Drug use: No  . Sexual activity: Not on file  Other Topics Concern  . Not on file  Social History Narrative  . Not on file   Social Determinants of Health   Financial Resource Strain:   . Difficulty of Paying Living Expenses: Not on file  Food Insecurity:   . Worried About Programme researcher, broadcasting/film/video in the Last Year: Not on file  . Ran Out of Food in the Last Year: Not on file  Transportation Needs:   . Lack of Transportation (Medical): Not on file  . Lack of Transportation (Non-Medical): Not on file  Physical Activity:   . Days of Exercise per Week: Not on file  . Minutes of Exercise per Session: Not on file  Stress:   . Feeling of Stress : Not on file  Social Connections:   . Frequency of Communication with Friends and Family: Not on file  . Frequency of Social Gatherings with Friends and Family: Not on file  . Attends Religious Services: Not on file  . Active Member of Clubs or Organizations: Not on file  . Attends Banker Meetings: Not on file  . Marital Status: Not on file        Objective:  Physical Exam: BP 97/62   Pulse 80   Temp 98.8 F (37.1 C) (Temporal)   Ht 5' 7.25" (1.708 m)   Wt 126 lb (57.2 kg)   SpO2 98%   BMI 19.59 kg/m   Body mass index is 19.59 kg/m. Wt Readings from Last 3 Encounters:  02/18/20 126 lb (57.2 kg)  11/27/18 116 lb 4 oz (52.7 kg)  03/12/18 119 lb 3.2 oz (54.1  kg)   Gen: NAD, resting comfortably HEENT: TMs normal bilaterally. OP clear. No thyromegaly noted.  CV: RRR with no murmurs appreciated Pulm: NWOB, CTAB with no crackles, wheezes, or rhonchi GI: Normal bowel sounds present. Soft, Nontender, Nondistended. MSK: no edema, cyanosis, or clubbing noted Skin: warm, dry Neuro: CN2-12 grossly intact. Strength 5/5 in upper and lower extremities. Reflexes symmetric and intact bilaterally.  Psych: Normal affect and thought content     Robert Hayden M. Jimmey Ralph, MD 02/18/2020 12:28 PM

## 2020-02-18 NOTE — Patient Instructions (Signed)
It was very nice to see you today!  Please start the Celexa. We will check blood work today.  Please check with me in a few weeks to let me know how the Celexa is working for you.  I will see back in 1 year for your next annual physical. Please come back to see me sooner if needed.  Take care, Dr Jerline Pain  Please try these tips to maintain a healthy lifestyle:   Eat at least 3 REAL meals and 1-2 snacks per day.  Aim for no more than 5 hours between eating.  If you eat breakfast, please do so within one hour of getting up.    Each meal should contain half fruits/vegetables, one quarter protein, and one quarter carbs (no bigger than a computer mouse)   Cut down on sweet beverages. This includes juice, soda, and sweet tea.     Drink at least 1 glass of water with each meal and aim for at least 8 glasses per day   Exercise at least 150 minutes every week.    Preventive Care 3-62 Years Old, Male Preventive care refers to lifestyle choices and visits with your health care provider that can promote health and wellness. This includes:  A yearly physical exam. This is also called an annual well check.  Regular dental and eye exams.  Immunizations.  Screening for certain conditions.  Healthy lifestyle choices, such as eating a healthy diet, getting regular exercise, not using drugs or products that contain nicotine and tobacco, and limiting alcohol use. What can I expect for my preventive care visit? Physical exam Your health care provider will check:  Height and weight. These may be used to calculate body mass index (BMI), which is a measurement that tells if you are at a healthy weight.  Heart rate and blood pressure.  Your skin for abnormal spots. Counseling Your health care provider may ask you questions about:  Alcohol, tobacco, and drug use.  Emotional well-being.  Home and relationship well-being.  Sexual activity.  Eating habits.  Work and work  Statistician. What immunizations do I need?  Influenza (flu) vaccine  This is recommended every year. Tetanus, diphtheria, and pertussis (Tdap) vaccine  You may need a Td booster every 10 years. Varicella (chickenpox) vaccine  You may need this vaccine if you have not already been vaccinated. Human papillomavirus (HPV) vaccine  If recommended by your health care provider, you may need three doses over 6 months. Measles, mumps, and rubella (MMR) vaccine  You may need at least one dose of MMR. You may also need a second dose. Meningococcal conjugate (MenACWY) vaccine  One dose is recommended if you are 65-64 years old and a Market researcher living in a residence hall, or if you have one of several medical conditions. You may also need additional booster doses. Pneumococcal conjugate (PCV13) vaccine  You may need this if you have certain conditions and were not previously vaccinated. Pneumococcal polysaccharide (PPSV23) vaccine  You may need one or two doses if you smoke cigarettes or if you have certain conditions. Hepatitis A vaccine  You may need this if you have certain conditions or if you travel or work in places where you may be exposed to hepatitis A. Hepatitis B vaccine  You may need this if you have certain conditions or if you travel or work in places where you may be exposed to hepatitis B. Haemophilus influenzae type b (Hib) vaccine  You may need this if you have  certain risk factors. You may receive vaccines as individual doses or as more than one vaccine together in one shot (combination vaccines). Talk with your health care provider about the risks and benefits of combination vaccines. What tests do I need? Blood tests  Lipid and cholesterol levels. These may be checked every 5 years starting at age 27.  Hepatitis C test.  Hepatitis B test. Screening   Diabetes screening. This is done by checking your blood sugar (glucose) after you have not eaten  for a while (fasting).  Sexually transmitted disease (STD) testing. Talk with your health care provider about your test results, treatment options, and if necessary, the need for more tests. Follow these instructions at home: Eating and drinking   Eat a diet that includes fresh fruits and vegetables, whole grains, lean protein, and low-fat dairy products.  Take vitamin and mineral supplements as recommended by your health care provider.  Do not drink alcohol if your health care provider tells you not to drink.  If you drink alcohol: ? Limit how much you have to 0-2 drinks a day. ? Be aware of how much alcohol is in your drink. In the [place] one drink equals one 12 oz bottle of beer (355 mL), one 5 oz glass of wine (148 mL), or one 1 oz glass of hard liquor (44 mL). Lifestyle  Take daily care of your teeth and gums.  Stay active. Exercise for at least 30 minutes on 5 or more days each week.  Do not use any products that contain nicotine or tobacco, such as cigarettes, e-cigarettes, and chewing tobacco. If you need help quitting, ask your health care provider.  If you are sexually active, practice safe sex. Use a condom or other form of protection to prevent STIs (sexually transmitted infections). What's next?  Go to your health care provider once a year for a well check visit.  Ask your health care provider how often you should have your eyes and teeth checked.  Stay up to date on all vaccines. This information is not intended to replace advice given to you by your health care provider. Make sure you discuss any questions you have with your health care provider. Document Revised: 04/02/2018 Document Reviewed: 04/02/2018 Elsevier Patient Education  2020 [employer].

## 2020-02-18 NOTE — Addendum Note (Signed)
Addended by: Dyann Kief on: 02/18/2020 01:27 PM   Modules accepted: Orders

## 2020-02-18 NOTE — Assessment & Plan Note (Signed)
Discussed options.  We will start Celexa as above.  Follow-up in few weeks via MyChart.

## 2020-02-21 LAB — LIPID PANEL
Cholesterol: 105 mg/dL (ref <200)
HDL: 38 mg/dL — ABNORMAL LOW (ref >=40)
LDL Cholesterol (Calc): 58 mg/dL (calc)
Non-HDL Cholesterol (Calc): 67 mg/dL (calc) (ref <130)
Total CHOL/HDL Ratio: 2.8 (calc) (ref <5.0)
Triglycerides: 33 mg/dL (ref <150)

## 2020-02-21 LAB — COMPREHENSIVE METABOLIC PANEL
AG Ratio: 1.8 (calc) (ref 1.0–2.5)
ALT: 7 U/L — ABNORMAL LOW (ref 9–46)
AST: 13 U/L (ref 10–40)
Albumin: 4.6 g/dL (ref 3.6–5.1)
Alkaline phosphatase (APISO): 44 U/L (ref 36–130)
BUN: 11 mg/dL (ref 7–25)
CO2: 26 mmol/L (ref 20–32)
Calcium: 9.7 mg/dL (ref 8.6–10.3)
Chloride: 105 mmol/L (ref 98–110)
Creat: 0.85 mg/dL (ref 0.60–1.35)
Globulin: 2.5 g/dL (calc) (ref 1.9–3.7)
Glucose, Bld: 76 mg/dL (ref 65–99)
Potassium: 4.5 mmol/L (ref 3.5–5.3)
Sodium: 139 mmol/L (ref 135–146)
Total Bilirubin: 0.7 mg/dL (ref 0.2–1.2)
Total Protein: 7.1 g/dL (ref 6.1–8.1)

## 2020-02-21 LAB — CBC
HCT: 44.9 % (ref 38.5–50.0)
Hemoglobin: 14.8 g/dL (ref 13.2–17.1)
MCHC: 33 g/dL (ref 32.0–36.0)
MCV: 84.1 fL (ref 80.0–100.0)
MPV: 11.1 fL (ref 7.5–12.5)
Platelets: 227 10*3/uL (ref 140–400)
RBC: 5.34 10*6/uL (ref 4.20–5.80)
RDW: 12.7 % (ref 11.0–15.0)
WBC: 4.7 10*3/uL (ref 3.8–10.8)

## 2020-02-21 LAB — URINE CYTOLOGY ANCILLARY ONLY
Chlamydia: NEGATIVE
Comment: NEGATIVE
Comment: NORMAL
Neisseria Gonorrhea: NEGATIVE

## 2020-02-21 LAB — HIV ANTIBODY (ROUTINE TESTING W REFLEX): HIV 1&2 Ab, 4th Generation: NONREACTIVE

## 2020-02-21 LAB — TSH: TSH: 1.11 mIU/L (ref 0.40–4.50)

## 2020-02-21 LAB — RPR: RPR Ser Ql: NONREACTIVE

## 2020-02-22 NOTE — Progress Notes (Signed)
Please inform patient of the following:  Labs are all STABLE. Would like for him to keep up the good work and we can recheck in a few years.  Katina Degree. Jimmey Ralph, MD 02/22/2020 1:05 PM

## 2020-04-22 DIAGNOSIS — S4991XA Unspecified injury of right shoulder and upper arm, initial encounter: Secondary | ICD-10-CM | POA: Diagnosis not present

## 2020-04-22 DIAGNOSIS — M25511 Pain in right shoulder: Secondary | ICD-10-CM | POA: Diagnosis not present

## 2020-04-26 ENCOUNTER — Other Ambulatory Visit: Payer: Self-pay

## 2020-04-26 ENCOUNTER — Ambulatory Visit (INDEPENDENT_AMBULATORY_CARE_PROVIDER_SITE_OTHER): Payer: BC Managed Care – PPO | Admitting: Family Medicine

## 2020-04-26 ENCOUNTER — Ambulatory Visit: Payer: BC Managed Care – PPO | Admitting: Family Medicine

## 2020-04-26 ENCOUNTER — Encounter: Payer: Self-pay | Admitting: Family Medicine

## 2020-04-26 ENCOUNTER — Ambulatory Visit (INDEPENDENT_AMBULATORY_CARE_PROVIDER_SITE_OTHER): Payer: BC Managed Care – PPO

## 2020-04-26 ENCOUNTER — Ambulatory Visit: Payer: Self-pay

## 2020-04-26 VITALS — BP 96/65 | HR 62 | Temp 98.1°F | Ht 67.25 in | Wt 120.2 lb

## 2020-04-26 VITALS — BP 110/72 | HR 78 | Ht 67.25 in | Wt 120.6 lb

## 2020-04-26 DIAGNOSIS — M25511 Pain in right shoulder: Secondary | ICD-10-CM

## 2020-04-26 MED ORDER — MELOXICAM 15 MG PO TABS: 15.0000 mg | ORAL_TABLET | Freq: Every day | ORAL | 0 refills | Status: DC

## 2020-04-26 MED ORDER — TRAMADOL HCL 50 MG PO TABS: 50.0000 mg | ORAL_TABLET | Freq: Three times a day (TID) | ORAL | 0 refills | Status: AC | PRN

## 2020-04-26 NOTE — Patient Instructions (Addendum)
Thank you for coming in today.  Please get an Xray today before you leave  You should hear from MRI scheduling within 1 week. If you do not hear please let me know.   Let me know which PT location you want to use.   I will offer a recheck after the MRI. Remind me that you are not local. This can be one likely over the phone.   Work on shoulder motion exercises. View at [email or web address] using code: [id number]

## 2020-04-26 NOTE — Patient Instructions (Signed)
It was very nice to see you today!  Please take meloxicam daily.  You can take the tramadol at night.  I will place a referral for you to see a sports medicine doctor to have him take a closer look.  You will probably need an ultrasound or an MRI to make sure that you do not have a rotator cuff tear.  Please let us know if your symptoms are not improving.  Take care, Dr Jimmey Ralph  Please try these tips to maintain a healthy lifestyle:   Eat at least 3 REAL meals and 1-2 snacks per day.  Aim for no more than 5 hours between eating.  If you eat breakfast, please do so within one hour of getting up.    Each meal should contain half fruits/vegetables, one quarter protein, and one quarter carbs (no bigger than a computer mouse)   Cut down on sweet beverages. This includes juice, soda, and sweet tea.     Drink at least 1 glass of water with each meal and aim for at least 8 glasses per day   Exercise at least 150 minutes every week.

## 2020-04-26 NOTE — Progress Notes (Signed)
X-ray right shoulder normal to radiology.  MRI should be helpful.

## 2020-04-26 NOTE — Progress Notes (Signed)
   Cordie Buening is a 25 y.o. male who presents today for an office visit.  Assessment/Plan:  Right Shoulder Pain Given recent normal x-ray will not repeat today - doubt dislocation or fracture.    Possible concern for possible rotator cuff tear.  Unable to perform adequate physical exam today due to degree of pain.  Will start meloxicam and tramadol.  Will place referral to sports medicine.  Will likely need to have further evaluation of soft tissue. Discussed reasons to return to care.     Subjective:  HPI:  Patient here with right shoulder pain.  Injury was 6 days ago while playing basketball.  States that he fell forward onto the anterior aspect of his right shoulder.  Had a lot of pain.  Try to play basketball afterwards but could not play due to the pain.  Is up going to urgent care in [place].  Reportedly had an x-ray done which was normal and then was told that he had a torn muscle or ligament and was discharged home.  Was told to take Tylenol as needed.  Symptoms have not improved over the last few days.  Has significant pain at rest and much worse with movement.  It is hard for him to lift his arm above the level of his shoulder.  No other specific treatments tried.       Objective:  Physical Exam: BP 96/65   Pulse 62   Temp 98.1 F (36.7 C) (Temporal)   Ht 5' 7.25" (1.708 m)   Wt 120 lb 3.2 oz (54.5 kg)   SpO2 100%   BMI 18.69 kg/m   Gen: No acute distress, resting comfortably CV: Regular rate and rhythm with no murmurs appreciated Pulm: Normal work of breathing, clear to auscultation bilaterally with no crackles, wheezes, or rhonchi MSK: Right shoulder tender to palpation.  No obvious deformities.  Distal clavicle with mild tenderness to palpation.  Limited range of motion due to pain.  Neurovascular intact distally. Neuro: Grossly normal, moves all extremities Psych: Normal affect and thought content      Lucan Riner M. Jimmey Ralph, MD 04/26/2020 10:03 AM

## 2020-04-26 NOTE — Progress Notes (Signed)
Subjective:    I'm seeing this patient as a consultation for:  Dr. Jimmey Ralph. Note will be routed back to referring provider/PCP.  CC: R shoulder pain  I, Molly Weber, LAT, ATC, am serving as scribe for Dr. Clementeen Graham.  HPI: Pt is a 25 y/o male presenting w/ acute-onset R shoulder pain x 6 days that began when he fell while playing basketball and landed on the ant aspect of his R shoulder.  He was seen at an Urgent Care in the [place] area and had a R shoulder XR.  Since then, pt reports con't R shoulder pain w/ no change in his symptoms.  He locates his pain to [facility] joint area and over anterior deltoid. Pt is guarding arm.  Patient lives and works in [place].  He is from this area and his primary care provider is still in the [place] area.  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  He has been unable to return to work since she injured his shoulder 6 days ago.  Radiating pain: yes- down to elbow R shoulder mechanical symptoms: none Aggravating factors: attempts at R shoulder AROM, particularly above 90 deg;  Treatments tried: Tylenol; Meloxicam; Tramadol;   Past medical history, Surgical history, Family history, Social history, Allergies, and medications have been entered into the medical record, reviewed.   Review of Systems: No new headache, visual changes, nausea, vomiting, diarrhea, constipation, dizziness, abdominal pain, skin rash, fevers, chills, night sweats, weight loss, swollen lymph nodes, body aches, joint swelling, muscle aches, chest pain, shortness of breath, mood changes, visual or auditory hallucinations.   Objective:    Vitals:   04/26/20 1301  BP: 110/72  Pulse: 78  SpO2: 98%   General: Well Developed, well nourished, and in no acute distress.  Neuro/Psych: Alert and oriented x3, extra-ocular muscles intact, able to move all 4 extremities, sensation grossly intact. Skin: Warm and dry, no rashes noted.  Respiratory: Not using accessory muscles, speaking  in full sentences, trachea midline.  Cardiovascular: Pulses palpable, no extremity edema. Abdomen: Does not appear distended. MSK: Right shoulder normal-appearing Tender palpation anterior and superior shoulder diffusely. Limited range of motion abduction to 90 degrees active and 120 degrees passive. External rotation full internal rotation to lumbar spine. Strength intact internal rotation 5/5.  External rotation 4+/5 with pain.  Abduction 4/5 with pain. Positive empty can test.  Inadequate shoulder range of motion for Hawkins or Neer's test positioning. Negative Yergason's and speeds test.  Lab and Radiology Results  X-ray images right shoulder obtained today personally and independently reviewed. No fractures visible.  No degenerative changes.  No dislocation. Await formal radiology review   Diagnostic Limited MSK Ultrasound of: Right shoulder Biceps tendon intact normal-appearing Subscapularis tendon is intact. Supraspinatus tendon is intact with hypoechoic fluid tracking superficial to the tendon consistent with subacromial bursitis. Infraspinatus tendon is then but no obvious tears or retraction visible. AC joint moderate effusion. Humeral bone cortex has a V-shaped depression at the supraspinatus viewing and infraspinatus viewing angles.  This is concerning for a Hill-Sachs lesion.  Correlation with x-ray does not show fracture here may be viewing the closed physis. Impression: Subacromial bursitis.  Possible infraspinatus rotator cuff tear.   Impression and Recommendations:    Assessment and Plan: 25 y.o. male with severe right shoulder pain after a fall occurring 6 days ago.  Patient has been unable to work or move his arm normally since he injured it 6 days ago.  X-ray today is  largely unremarkable and ultrasound supports concern for rotator cuff tear.  Plan for MRI to further characterize cause of shoulder pain.  This is medical urgency due to the severity of pain and his  inability to work.  Plan for range of motion exercises taught in clinic today by ATC.  Anticipate referring to physical therapy.  Because he lives in [place] physical therapy should probably be done in [place].  MRI was ordered for a shoulder dislocation as well.Marland Kitchen  PDMP not reviewed this encounter. Orders Placed This Encounter  Procedures  . DG Shoulder Right    Standing Status:   Future    Number of Occurrences:   1    Standing Expiration Date:   04/26/2021    Order Specific Question:   Reason for Exam (SYMPTOM  OR DIAGNOSIS REQUIRED)    Answer:   right shoulder pain    Order Specific Question:   Preferred imaging location?    Answer:   [facility]  . [place] LIMITED JOINT SPACE STRUCTURES UP RIGHT(NO LINKED CHARGES)    Standing Status:   Future    Number of Occurrences:   1    Standing Expiration Date:   10/24/2020    Order Specific Question:   Reason for Exam (SYMPTOM  OR DIAGNOSIS REQUIRED)    Answer:   right shoulder pain    Order Specific Question:   Preferred imaging location?    Answer:   Adult nurse [facility]  . MR SHOULDER RIGHT WO CONTRAST    Schedule in [place] area. Pt is an employee of [employer].    Standing Status:   Future    Standing Expiration Date:   04/26/2021    Order Specific Question:   What is the patient's sedation requirement?    Answer:   No Sedation    Order Specific Question:   Does the patient have a pacemaker or implanted devices?    Answer:   No    Order Specific Question:   Preferred imaging location?    Answer:   External   No orders of the defined types were placed in this encounter.   Discussed warning signs or symptoms. Please see discharge instructions. Patient expresses understanding.   The above documentation has been reviewed and is accurate and complete Clementeen Graham, M.D.

## 2020-05-01 ENCOUNTER — Telehealth: Payer: Self-pay | Admitting: Family Medicine

## 2020-05-01 DIAGNOSIS — M25511 Pain in right shoulder: Secondary | ICD-10-CM

## 2020-05-01 NOTE — Telephone Encounter (Signed)
Pt ordered

## 2020-05-01 NOTE — Telephone Encounter (Signed)
Spoke w/ pt and PT referral has already called him and he is scheduled for treatment.

## 2020-05-01 NOTE — Telephone Encounter (Signed)
Pt called today, gave him the phone # for scheduled MRI at [employer] and advised him to follow up after MRI.  Please note, patient would like to proceed with PT and would like this to be done in [place]. Requesting we order.

## 2020-05-04 ENCOUNTER — Telehealth: Payer: Self-pay

## 2020-05-04 ENCOUNTER — Other Ambulatory Visit: Payer: Self-pay

## 2020-05-04 ENCOUNTER — Ambulatory Visit: Payer: BC Managed Care – PPO | Attending: Family Medicine

## 2020-05-04 DIAGNOSIS — R600 Localized edema: Secondary | ICD-10-CM | POA: Diagnosis not present

## 2020-05-04 DIAGNOSIS — M25511 Pain in right shoulder: Secondary | ICD-10-CM | POA: Insufficient documentation

## 2020-05-04 DIAGNOSIS — M25611 Stiffness of right shoulder, not elsewhere classified: Secondary | ICD-10-CM | POA: Insufficient documentation

## 2020-05-04 DIAGNOSIS — M6281 Muscle weakness (generalized): Secondary | ICD-10-CM | POA: Diagnosis not present

## 2020-05-04 DIAGNOSIS — R293 Abnormal posture: Secondary | ICD-10-CM | POA: Insufficient documentation

## 2020-05-04 NOTE — Therapy (Signed)
[facility] [address] [place], [place], [address] Phone: [phone]   Fax:  [phone]  Physical Therapy Evaluation  Patient Details  Name: Robert Hayden MRN: [id number] Date of Birth: 10-28-1995 Referring Provider (PT): Denyse Amass   Encounter Date: 05/04/2020   PT End of Session - 05/04/20 1056    Visit Number 1    Number of Visits 17    Date for PT Re-Evaluation 06/29/20    PT Start Time 0930    PT Stop Time 1020    PT Time Calculation (min) 50 min    Activity Tolerance Patient tolerated treatment well;Patient limited by pain    Behavior During Therapy [facility] for tasks assessed/performed           No past medical history on file.  No past surgical history on file.  There were no vitals filed for this visit.    Subjective Assessment - 05/04/20 0928    Subjective On 04/20/20 Pt was playing basketball and fell on the basketball, hitting the front of his right shoulder then hitting the back. After about 20 minutes, he felt better and continued to play. He then went back to play again later on in the day as well feeling fine with just minor pain. 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 Pain initially sharp all the time but now intermittent sharp pain. 04/22/20 he went to see sports medicine for right shoulder pain. Pt has been out of work for about 3 weeks due to injury.    How long can you sit comfortably? unlimited    How long can you stand comfortably? unlimited    Diagnostic tests has had previous x-rays, diagnositc [place]    Patient Stated Goals To have shoulder feel normal, decrease pain, to move arm fully again, to return to PLOF    Currently in Pain? Yes    Pain Score 7    Best: 3/10. Worst: 8-9/10   Pain Location Shoulder    Pain Orientation Right;Proximal;Upper    Pain Descriptors / Indicators Sharp;Aching;Sore;Spasm;Constant;Tightness    Pain Type  Acute pain    Pain Radiating Towards With initial injury, was experiencing radiation down arm into hand numbness and tingling. Has not experienced this for about a week.    Pain Onset 1 to 4 weeks ago    Pain Frequency Constant    Aggravating Factors  Stretching, certain mvmts    Pain Relieving Factors Nothing    Effect of Pain on Daily Activities Unable to use dominant hand for ADLs, unable to participate in work/complete owrk duties              [facility] PT Assessment - 05/04/20 0001      Assessment   Medical Diagnosis Acute pain of Right shoulder    Referring Provider (PT) Denyse Amass    Onset Date/Surgical Date 04/20/20    Hand Dominance Right    Prior Therapy none      Balance Screen   Has the patient fallen in the past 6 months Yes   Date of injury, playing basketball   How many times? 1    Has the patient had a decrease in activity level because of a fear of falling?  No    Is the patient reluctant to leave their home because of a fear of falling?  No      Home Environment   Living Environment  Other (Comment)    Additional Comments Currently staying with grandparents in their apartment. Otherwise lives in an apartment in [place] with parents.      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    Focus on Therapeutic Outcomes (FOTO)  32/100 = Stage 2 poor shoulder function      Posture/Postural Control   Posture Comments Slouched, forward head      ROM / Strength   AROM / PROM / Strength AROM;PROM;Strength      AROM   AROM Assessment Site Shoulder    Right/Left Shoulder Right;Left     Right Shoulder Extension 10 Degrees   pain exacerbated to 9/10   Right Shoulder Flexion 110 Degrees   + pain at end range   Right Shoulder ABduction 110 Degrees   + pain at end range   Right Shoulder Internal Rotation --   to R iliac crest, + pain 9/10   Left Shoulder Extension 50 Degrees    Left Shoulder Flexion 160 Degrees    Left Shoulder ABduction 170 Degrees      PROM   PROM Assessment Site Shoulder    Right/Left Shoulder Right;Left    Right Shoulder Extension 30 Degrees   + pain   Right Shoulder Flexion 145 Degrees   sharp pain at end range   Right Shoulder ABduction 150 Degrees   sharp pain at end range     Strength   Strength Assessment Site Shoulder;Elbow;Other (comment)    Right/Left Shoulder Right;Left    Right Shoulder Flexion 3-/5   partial range vs gravity   Right Shoulder ABduction 3-/5    Right Shoulder Internal Rotation 3-/5   + pain   Right Shoulder External Rotation 3-/5   + pain exacerbated to 9/10   Left Shoulder Flexion 4/5    Left Shoulder ABduction 4/5    Left Shoulder Internal Rotation 4/5    Left Shoulder External Rotation 4/5    Right/Left Elbow Right;Left    Right Elbow Flexion 3+/5   + pain near biceps tendon with minimal resistance   Left Elbow Flexion 4/5      Palpation   Palpation comment mild swelling noted at right supraspinatus and infraspinatus compared to left side. Significant tenderness to palpation at the RIGHT superior and anterior GHJ, AC joint, and in region of subacromial bursa palpated with shoulder extension      Special Tests    Special Tests Rotator Cuff Impingement;Laxity/Instability Tests    Other special tests Piano key sign for [facility] joint: RIGHT = + pain and slight hypermobility compared to left side.    Rotator Cuff Impingment tests Painful Arc of Motion;other;Empty Can test    Laxity/Instability  AC Joint horizontal adduction      Empty Can test   Findings Positive    Side Right      Painful Arc of Motion   Findings  Positive    Side Right      other   Findings Positive    Side Right    Comments Infraspinatus ms test/Resisted ER test  AC joint horizontal adduction    Findings Negative                      Objective measurements completed on examination: See above findings.               PT Education - 05/04/20 1054    Education Details Access Code: [email or web address]  URL: [email or web address]  Date: 05/04/2020  Prepared by: Claude Manges    Program Notes  use ice/cold pack at least 3x daily for 15-20 minutes at a time.       Exercises:  Circular Shoulder Pendulum with Table Support - 1 x daily - 7 x weekly - 3 sets - 10 reps  Seated Shoulder Abduction Towel Slide at Table Top - 1 x daily - 7 x weekly - 3 sets - 10 reps  Seated Shoulder Flexion Slide at Table Top with Forearm in Neutral - 1 x daily - 7 x weekly - 3 sets - 10 reps    Patient Education  [employer]) Educated Patient    Methods Explanation;Demonstration;Tactile cues;Verbal cues;Handout    Comprehension Verbalized understanding;Returned demonstration            PT Short Term Goals - 05/04/20 1304      PT SHORT TERM GOAL #1   Title Pt will be independent with initial HEP    Time 2    Period Weeks    Status New    Target Date 05/18/20             PT Long Term Goals - 05/04/20 1306      PT LONG TERM GOAL #1   Title Pt will demonstrate full right shoulder flexion and abduction AROM compared to left side with </= 1/10 pain.    Time 8    Period Weeks    Status New    Target Date 06/29/20      PT LONG TERM GOAL #2   Title Pt will have full right shoulder extension and internal rotation A/PROM compared to left side to demo improved functional UE mobility.    Time 8    Period Weeks    Status New    Target Date 06/29/20      PT LONG TERM GOAL #3   Title Pt will report able to return to work as transport with </= 1/10 R shoulder with pushing, pulling and lifting activities.    Time  8    Period Weeks    Status New    Target Date 06/29/20      PT LONG TERM GOAL #4   Title Pt will report able to perform ADLs with </= 1/10 R shoulder pain    Time 8    Period Weeks    Status New    Target Date 06/29/20                  Plan - 05/04/20 1057    Clinical Impression Statement Pt is a kind 25 year old male who presents to PT post RIGHT shoulder injury sustained during a fall while playing basketball. Diagnostic ultrasound was performed at Sports Medicine which determined possible presence of subacromial bursitis, possible RTC involvement. He presents with significant pain, mild edema/swelling,  limitations in AROM, and muscle weakness of the right shoulder. As a result, he has difficulty with ADLs, and is unable to complete work duties (push/pull/lift) at this time.  He will benefit from skilled PT to decrease  pain and improve strength, mobility and return to independence and PLOF   Personal Factors and Comorbidities Fitness    Examination-Activity Limitations Bathing;Bed Mobility;Reach Overhead;Self Feeding;Carry;Dressing;Hygiene/Grooming;Lift    Examination-Participation Restrictions Community Activity;Occupation    Stability/Clinical Decision Making Evolving/Moderate complexity    Clinical Decision Making Moderate    Rehab Potential Good    PT Frequency 2x / week    PT Duration 8 weeks    PT Treatment/Interventions ADLs/Self Care Home Management;Cryotherapy;Electrical Stimulation;Moist Heat;Functional mobility training;Therapeutic activities;Therapeutic exercise;Neuromuscular re-education;Manual techniques;Patient/family education;Vasopneumatic Device;Passive range of motion    PT Next Visit Plan Assess HEP carryover, focus on pain/edema management, gradually progress shoulder P/AAROM to tolerance, use modalities and manual as needed    PT Home Exercise Plan ice pack to right shoulder at least 3x/day, Table shoulder flexion and abduction, pendulums . Exercises were  completed and reviewed during initial eval with cues provided for proper form.    Consulted and Agree with Plan of Care Patient           Patient will benefit from skilled therapeutic intervention in order to improve the following deficits and impairments:  Decreased range of motion,Impaired UE functional use,Increased muscle spasms,Decreased endurance,Decreased activity tolerance,Improper body mechanics,Decreased mobility,Decreased strength,Increased edema,Postural dysfunction  Visit Diagnosis: Acute pain of right shoulder - Plan: PT plan of care cert/re-cert  Stiffness of right shoulder, not elsewhere classified - Plan: PT plan of care cert/re-cert  Localized edema - Plan: PT plan of care cert/re-cert  Abnormal posture - Plan: PT plan of care cert/re-cert  Muscle weakness (generalized) - Plan: PT plan of care cert/re-cert     Problem List Patient Active Problem List   Diagnosis Date Noted  . Migraines 11/27/2018  . Depression, major, single episode, mild ([facility]) 11/27/2018  . Anxiety 11/27/2018    Anson CroftsMonica L Jayshon Dommer, PT, DPT 05/04/2020, 5:38 PM  [facility] [address] [place], [place], [address] Phone: [phone]   Fax:  [phone]  Name: Robert Hayden MRN: [id number] Date of Birth: February 06, 1996

## 2020-05-04 NOTE — Telephone Encounter (Signed)
Patient is calling in asking for a continuance for leave from work. States Dr.Corey advised that it may be another 2 weeks before he recommends Baby go back to work. Wondered if Dr.Parker could write him another letter that states he is able to go back Feb. 1st.

## 2020-05-04 NOTE — Telephone Encounter (Signed)
Advised pt to get note from Dr. Zollie Pee office. Pt voiced understanding.

## 2020-05-05 ENCOUNTER — Encounter: Payer: Self-pay | Admitting: Family Medicine

## 2020-05-05 NOTE — Telephone Encounter (Signed)
Patient called in response to the message below. He asked if Dr Denyse Amass would be able to write him a note for work stating that he can return on Tuesday, January 25th.  Please advise.  Patient would like a call back when this is completed so that he can pick it up.

## 2020-05-05 NOTE — Telephone Encounter (Signed)
Note is ready for pickup.  Alternatively if you know the fax number you would like me to send it we can fax it.  Additionally if you need me to we can scan it and email it to you.  Otherwise it will be ready at the front desk.

## 2020-05-05 NOTE — Telephone Encounter (Signed)
Called pt and he is on his way to pick up his updated work note.

## 2020-05-08 ENCOUNTER — Ambulatory Visit: Payer: BC Managed Care – PPO | Admitting: Physical Therapy

## 2020-05-10 ENCOUNTER — Encounter: Payer: Self-pay | Admitting: Physical Therapy

## 2020-05-10 ENCOUNTER — Ambulatory Visit: Payer: BC Managed Care – PPO | Admitting: Physical Therapy

## 2020-05-10 ENCOUNTER — Other Ambulatory Visit: Payer: Self-pay

## 2020-05-10 DIAGNOSIS — M25611 Stiffness of right shoulder, not elsewhere classified: Secondary | ICD-10-CM

## 2020-05-10 DIAGNOSIS — M6281 Muscle weakness (generalized): Secondary | ICD-10-CM | POA: Diagnosis not present

## 2020-05-10 DIAGNOSIS — M25511 Pain in right shoulder: Secondary | ICD-10-CM | POA: Diagnosis not present

## 2020-05-10 DIAGNOSIS — R293 Abnormal posture: Secondary | ICD-10-CM | POA: Diagnosis not present

## 2020-05-10 DIAGNOSIS — R600 Localized edema: Secondary | ICD-10-CM

## 2020-05-10 NOTE — Therapy (Signed)
[address] [place], [place], [address] Phone: [phone]   Fax:  [phone]  Physical Therapy Treatment  Patient Details  Name: Robert Hayden MRN: [id number] Date of Birth: 1995/09/02 Referring Provider (PT): Marikay Alar Date: 05/10/2020   PT End of Session - 05/10/20 0931    Visit Number 2    Date for PT Re-Evaluation 06/29/20    PT Start Time 0845    PT Stop Time 0930    PT Time Calculation (min) 45 min    Activity Tolerance Patient tolerated treatment well    Behavior During Therapy [facility] for tasks assessed/performed           History reviewed. No pertinent past medical history.  History reviewed. No pertinent surgical history.  There were no vitals filed for this visit.   Subjective Assessment - 05/10/20 0844    Subjective doing fine, compliant with HEP    Currently in Pain? Yes    Pain Score 7     Pain Location Shoulder    Pain Orientation Right                             [facility] Adult PT Treatment/Exercise - 05/10/20 0001      Exercises   Exercises Shoulder      Shoulder Exercises: Standing   External Rotation Theraband;20 reps;Strengthening;Both    Theraband Level (Shoulder External Rotation) Level 2 (Red)    Flexion Weights;20 reps;Strengthening;Both    Shoulder Flexion Weight (lbs) 3    ABduction Strengthening;Theraband;20 reps;Both    Shoulder ABduction Weight (lbs) 2    Extension Strengthening;Both;20 reps;Theraband    Theraband Level (Shoulder Extension) Level 3 (Green)    Row Strengthening;Both;20 reps;Theraband    Theraband Level (Shoulder Row) Level 3 (Green)    Other Standing Exercises AAROM 3lb var flex, ext, IR up back x10 each      Shoulder Exercises: ROM/Strengthening   UBE (Upper Arm Bike) L1 x 3 min each      Manual Therapy   Manual Therapy Scapular mobilization;Neural Stretch;Joint mobilization;Internal Pelvic Floor;Passive ROM    Joint  Mobilization R GH graded 2-3    Passive ROM R shoulder AROM in all directions                    PT Short Term Goals - 05/10/20 0943      PT SHORT TERM GOAL #1   Title Pt will be independent with initial HEP    Status Achieved             PT Long Term Goals - 05/10/20 0943      PT LONG TERM GOAL #2   Title Pt will have full right shoulder extension and internal rotation A/PROM compared to left side to demo improved functional UE mobility.    Status Partially Met                 Plan - 05/10/20 0932    Clinical Impression Statement Pt tolerated an initial progression to TE well. Some pain and tightness reported with AAROM towards the end range. He stated some R shoulder burning throughout with all strengthening exercises. Positive response to MT evident but increase tissues elasticity and increase A/PROM.    Personal Factors and Comorbidities Fitness    Examination-Activity Limitations Bathing;Bed Mobility;Reach Overhead;Self Feeding;Carry;Dressing;Hygiene/Grooming;Lift    Examination-Participation Restrictions Community Activity;Occupation    Merchant [employer] officer  Evolving/Moderate complexity    Rehab Potential Good    PT Frequency 2x / week    PT Duration 8 weeks    PT Treatment/Interventions ADLs/Self Care Home Management;Cryotherapy;Electrical Stimulation;Moist Heat;Functional mobility training;Therapeutic activities;Therapeutic exercise;Neuromuscular re-education;Manual techniques;Patient/family education;Vasopneumatic Device;Passive range of motion    PT Next Visit Plan focus on pain/edema management, gradually progress shoulder P/AAROM to tolerance, use modalities and manual as needed           Patient will benefit from skilled therapeutic intervention in order to improve the following deficits and impairments:  Decreased range of motion,Impaired UE functional use,Increased muscle spasms,Decreased endurance,Decreased activity  tolerance,Improper body mechanics,Decreased mobility,Decreased strength,Increased edema,Postural dysfunction  Visit Diagnosis: Stiffness of right shoulder, not elsewhere classified  Acute pain of right shoulder  Localized edema  Abnormal posture     Problem List Patient Active Problem List   Diagnosis Date Noted  . Migraines 11/27/2018  . 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 Anxiety 11/27/2018    Scot Jun 05/10/2020, 9:43 AM  [address] [place], [place], [address] Phone: [phone]   Fax:  [phone]  Name: Iver Miklas MRN: [id number] Date of Birth: [birth year]

## 2020-05-12 ENCOUNTER — Ambulatory Visit: Payer: BC Managed Care – PPO | Admitting: Physical Therapy

## 2020-05-15 DIAGNOSIS — M7551 Bursitis of right shoulder: Secondary | ICD-10-CM | POA: Diagnosis not present

## 2020-05-15 DIAGNOSIS — R936 Abnormal findings on diagnostic imaging of limbs: Secondary | ICD-10-CM | POA: Diagnosis not present

## 2020-05-15 DIAGNOSIS — M25511 Pain in right shoulder: Secondary | ICD-10-CM | POA: Diagnosis not present

## 2020-05-19 ENCOUNTER — Other Ambulatory Visit: Payer: Self-pay | Admitting: Family Medicine

## 2020-05-19 ENCOUNTER — Ambulatory Visit: Payer: BC Managed Care – PPO | Admitting: Physical Therapy

## 2020-08-30 ENCOUNTER — Other Ambulatory Visit: Payer: Self-pay

## 2020-08-30 ENCOUNTER — Ambulatory Visit (INDEPENDENT_AMBULATORY_CARE_PROVIDER_SITE_OTHER): Payer: BC Managed Care – PPO | Admitting: Family Medicine

## 2020-08-30 ENCOUNTER — Encounter: Payer: Self-pay | Admitting: Family Medicine

## 2020-08-30 VITALS — BP 106/65 | HR 69 | Temp 98.7°F | Ht 67.25 in | Wt 119.0 lb

## 2020-08-30 DIAGNOSIS — F320 Major depressive disorder, single episode, mild: Secondary | ICD-10-CM | POA: Diagnosis not present

## 2020-08-30 DIAGNOSIS — K625 Hemorrhage of anus and rectum: Secondary | ICD-10-CM | POA: Diagnosis not present

## 2020-08-30 DIAGNOSIS — F419 Anxiety disorder, unspecified: Secondary | ICD-10-CM

## 2020-08-30 NOTE — Assessment & Plan Note (Signed)
Doing well on Celexa 20 mg daily.

## 2020-08-30 NOTE — Patient Instructions (Signed)
It was very nice to see you today!  You probably have a hemorrhoid due to constipation.  Please take over-the-counter MiraLAX and Metamucil to help with this.  I am concerned that you may potentially have inflammatory bowel disease.  This is unlikely though something you still need to rule out.  I will refer you to a gastroenterologist for them to take a look.  Please let us know if your symptoms are worsening.  Take care, Dr Jimmey Ralph  PLEASE NOTE:  If you had any lab tests please let us know if you have not heard back within a few days. You may see your results on mychart before we have a chance to review them but we will give you a call once they are reviewed by [place]. If we ordered any referrals today, please let us know if you have not heard from their office within the next week.   Please try these tips to maintain a healthy lifestyle:   Eat at least 3 REAL meals and 1-2 snacks per day.  Aim for no more than 5 hours between eating.  If you eat breakfast, please do so within one hour of getting up.    Each meal should contain half fruits/vegetables, one quarter protein, and one quarter carbs (no bigger than a computer mouse)   Cut down on sweet beverages. This includes juice, soda, and sweet tea.     Drink at least 1 glass of water with each meal and aim for at least 8 glasses per day   Exercise at least 150 minutes every week.

## 2020-08-30 NOTE — Progress Notes (Signed)
   Robert Hayden is a 25 y.o. male who presents today for an office visit.  Assessment/Plan:  New/Acute Problems: Rectal bleeding Possibly internal hemorrhoid though concern for possible inflammatory bowel disease given his rectal bleeding, abdominal pain, and weight loss.  Will place referral to GI for further evaluation.  In the meantime recommended that he start fiber supplement with Metamucil and also start daily dose of MiraLAX to help with this possible hemorrhoid.  Discussed reasons to return to care.  Chronic Problems Addressed Today: Anxiety Doing well on Celexa 20 mg daily.  We will continue this.  Depression, major, single episode, mild (HCC) Doing well on Celexa 20 mg daily.    Subjective:  HPI:  See A/P for status of chronic conditions  Over last several weeks to months he has noticed intermittent rectal bleeding.  Initially was just noticing blood on toilet paper while wiping however a couple days ago had significant amount of bleeding resulting in his level becoming filled with blood.  He has had some associated abdominal pain and left lower quadrant for the last several weeks on and off as well.  No nausea or vomiting.  No fevers or chills.  Has had some weight loss the last several weeks.  No family history of inflammatory bowel disease.       Objective:  Physical Exam: BP 106/65   Pulse 69   Temp 98.7 F (37.1 C) (Temporal)   Ht 5' 7.25" (1.708 m)   Wt 119 lb (54 kg)   SpO2 99%   BMI 18.50 kg/m   Wt Readings from Last 3 Encounters:  08/30/20 119 lb (54 kg)  04/26/20 120 lb 9.6 oz (54.7 kg)  04/26/20 120 lb 3.2 oz (54.5 kg)  Gen: No acute distress, resting comfortably CV: Regular rate and rhythm with no murmurs appreciated Pulm: Normal work of breathing, clear to auscultation bilaterally with no crackles, wheezes, or rhonchi Neuro: Grossly normal, moves all extremities Psych: Normal affect and thought content      Vista Sawatzky M. Jimmey Ralph, MD 08/30/2020 12:00  PM

## 2020-08-30 NOTE — Assessment & Plan Note (Signed)
Doing well on Celexa 20 mg daily.  We will continue this.

## 2020-09-19 DIAGNOSIS — S61212A Laceration without foreign body of right middle finger without damage to nail, initial encounter: Secondary | ICD-10-CM | POA: Diagnosis not present

## 2020-09-19 DIAGNOSIS — S61213A Laceration without foreign body of left middle finger without damage to nail, initial encounter: Secondary | ICD-10-CM | POA: Diagnosis not present

## 2020-09-19 DIAGNOSIS — Z049 Encounter for examination and observation for unspecified reason: Secondary | ICD-10-CM | POA: Diagnosis not present

## 2020-09-19 DIAGNOSIS — W540XXA Bitten by dog, initial encounter: Secondary | ICD-10-CM | POA: Diagnosis not present

## 2020-09-19 DIAGNOSIS — S61252A Open bite of right middle finger without damage to nail, initial encounter: Secondary | ICD-10-CM | POA: Diagnosis not present

## 2021-01-03 DIAGNOSIS — L03012 Cellulitis of left finger: Secondary | ICD-10-CM | POA: Diagnosis not present

## 2021-02-10 IMAGING — DX DG SHOULDER 2+V*R*
3 series · 3 of 3 positions shown · non-contrast
Comparison: None.

CLINICAL DATA: 24-year-old male with acute RIGHT shoulder pain
following basketball injury 1 week ago. Initial encounter.

EXAM:
RIGHT SHOULDER - 2+ VIEW

[shoulder ap (1 of 2)]
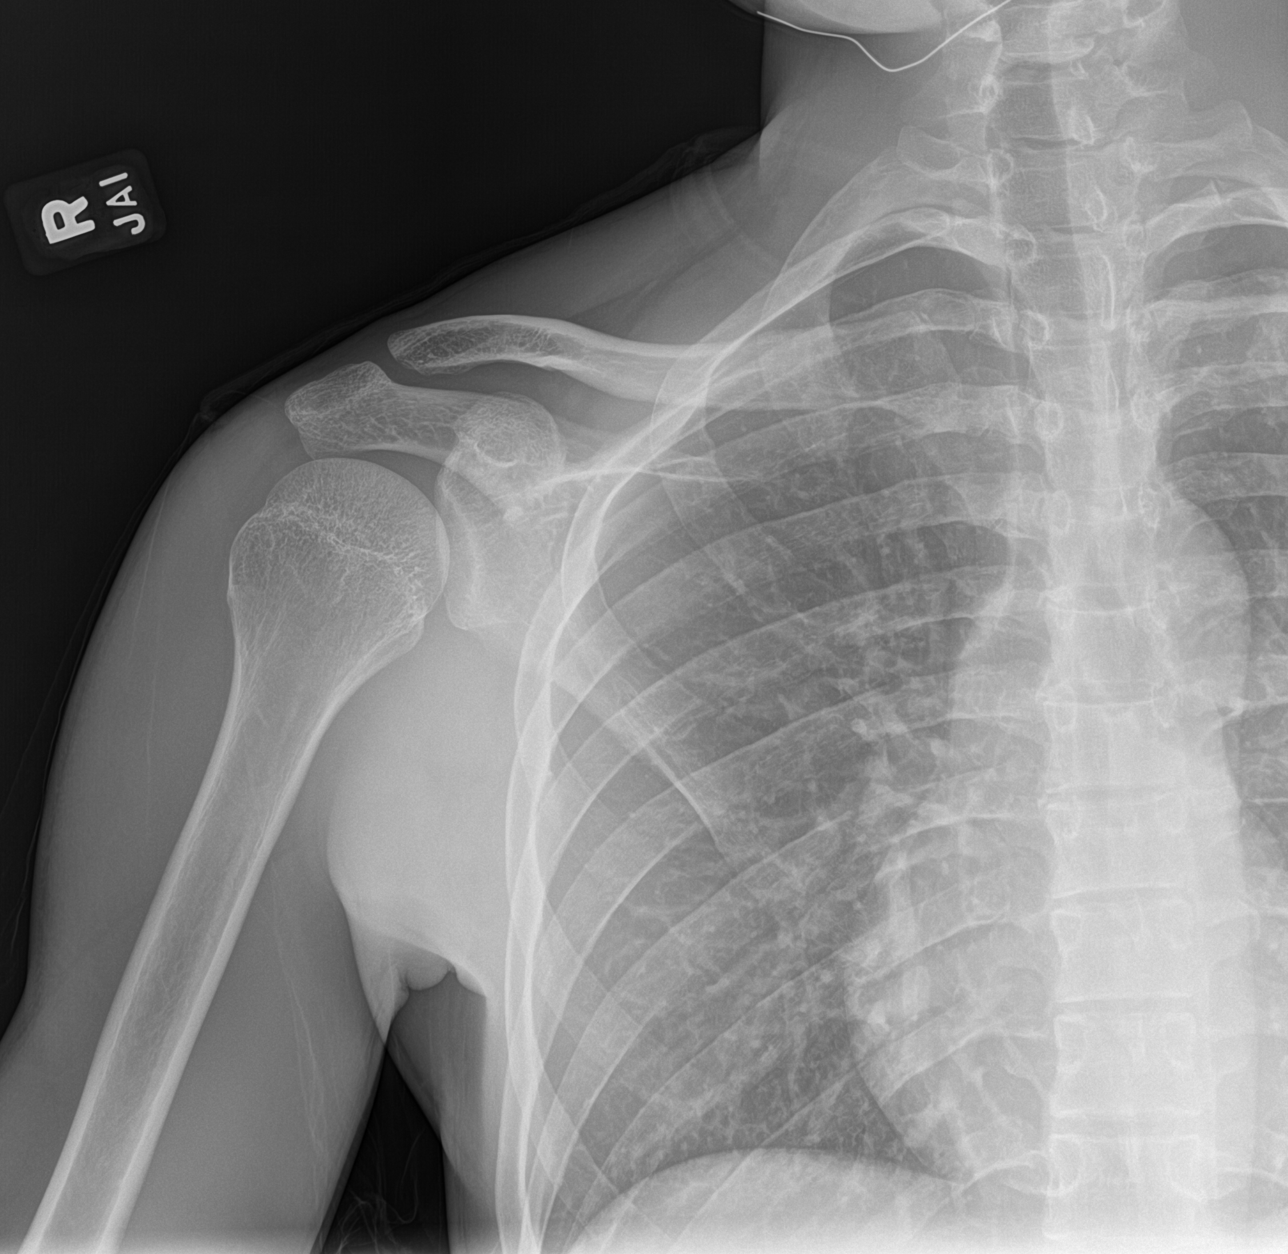

[shoulder ap (2 of 2)]
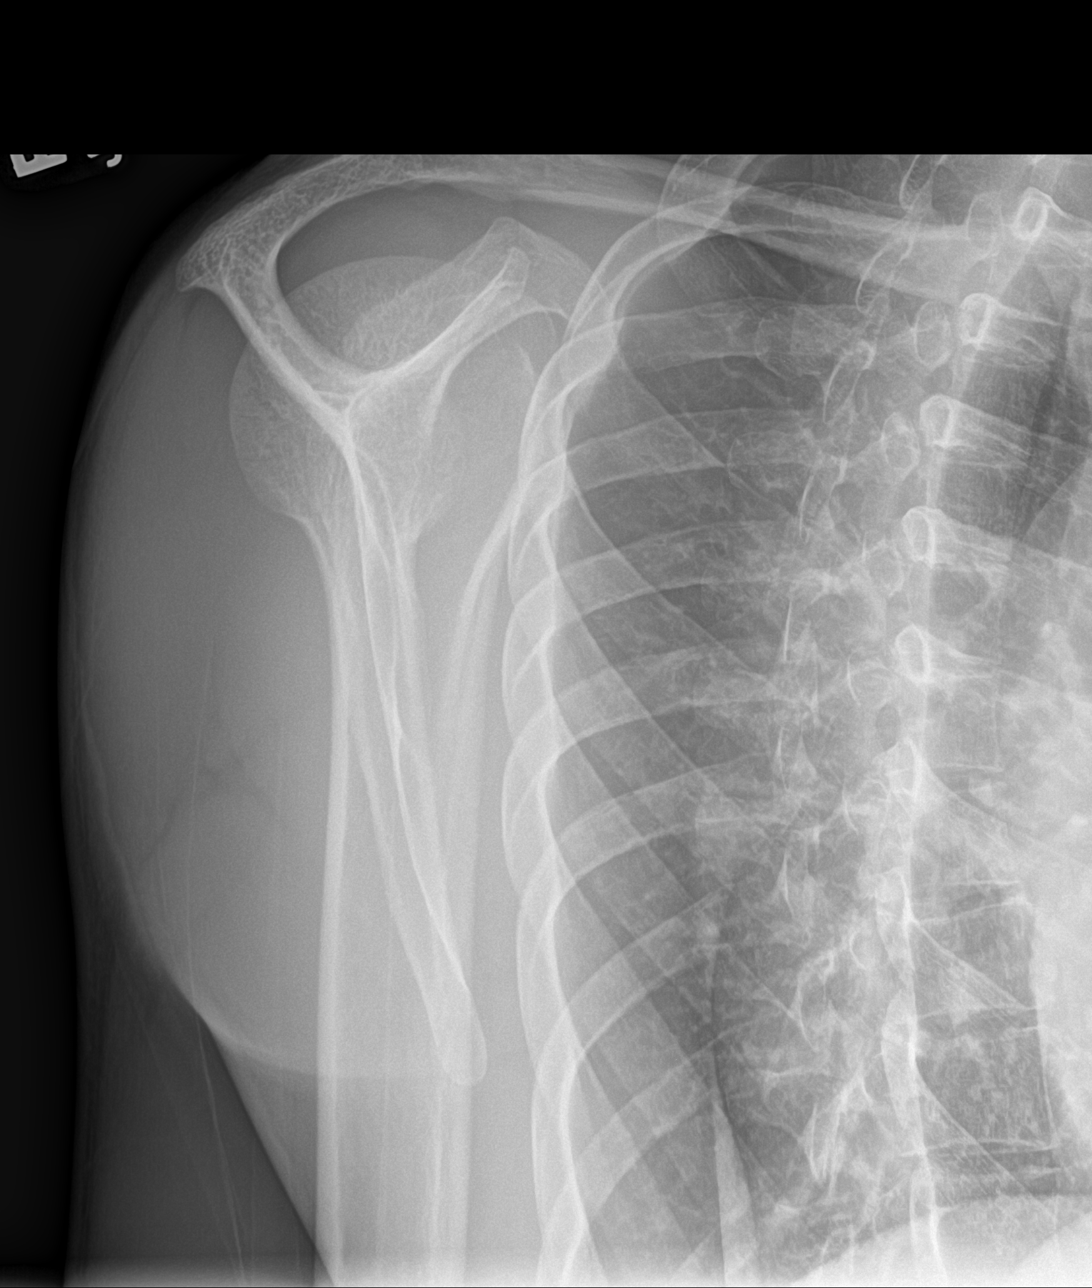

[shoulder axial]
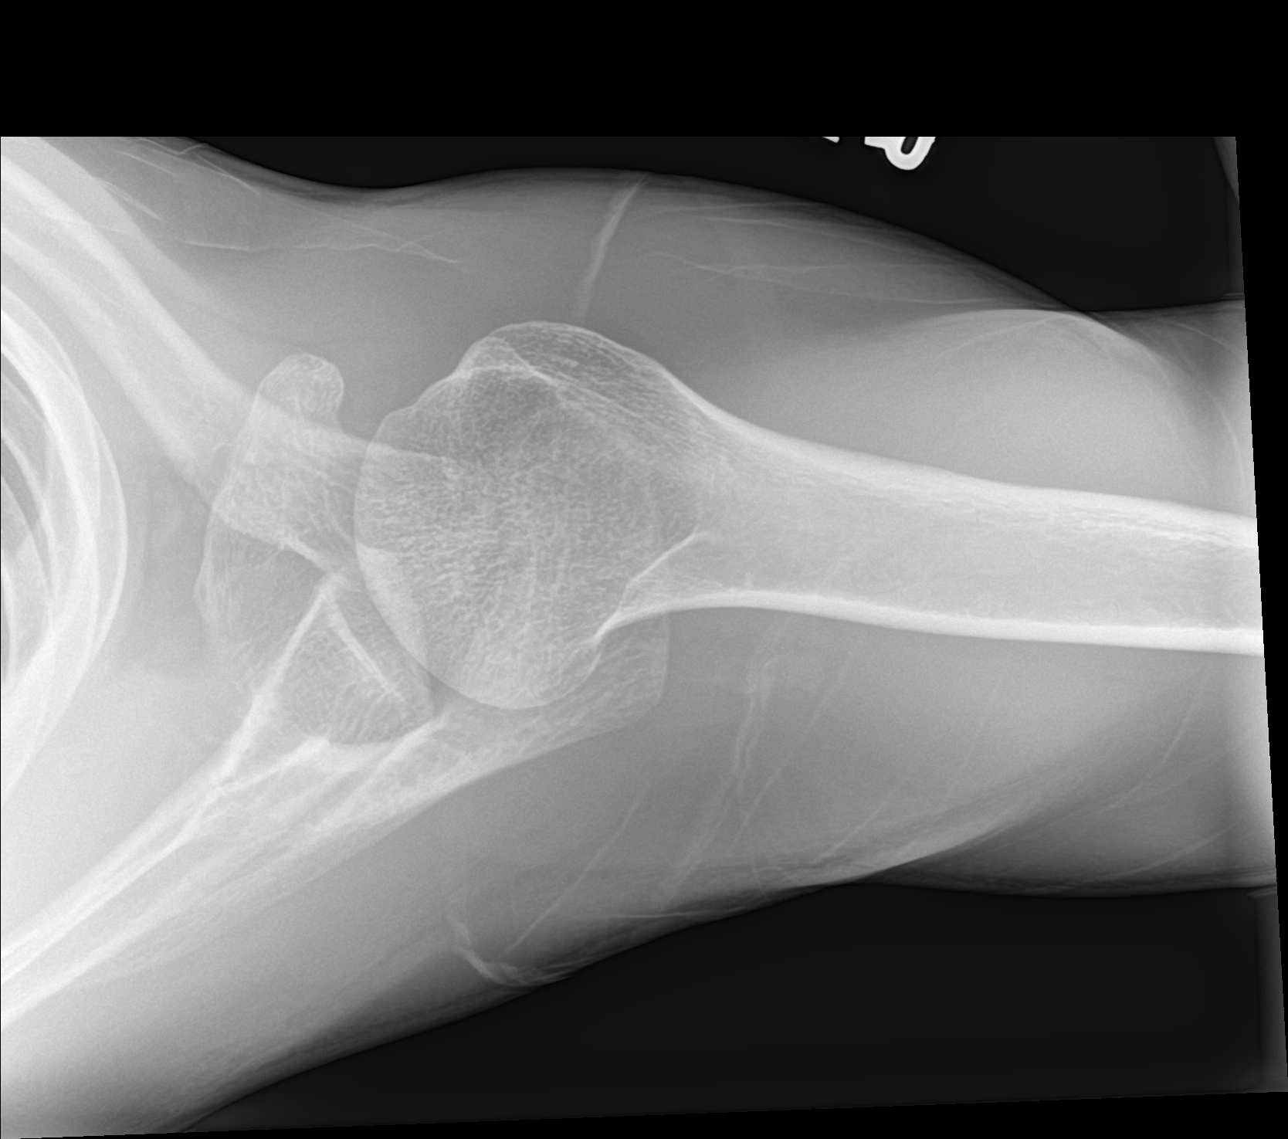

[3 of 3 positions shown; findings below may reference images not displayed]

FINDINGS: There is no evidence of fracture or dislocation. There is no
evidence of arthropathy or other focal bone abnormality. Soft
tissues are unremarkable.
IMPRESSION: Negative.

## 2021-02-21 DIAGNOSIS — R300 Dysuria: Secondary | ICD-10-CM | POA: Diagnosis not present

## 2021-02-21 DIAGNOSIS — Z202 Contact with and (suspected) exposure to infections with a predominantly sexual mode of transmission: Secondary | ICD-10-CM | POA: Diagnosis not present

## 2022-02-08 DIAGNOSIS — R369 Urethral discharge, unspecified: Secondary | ICD-10-CM | POA: Diagnosis not present

## 2022-02-28 ENCOUNTER — Ambulatory Visit (INDEPENDENT_AMBULATORY_CARE_PROVIDER_SITE_OTHER): Payer: BC Managed Care – PPO | Admitting: Family Medicine

## 2022-02-28 ENCOUNTER — Encounter: Payer: Self-pay | Admitting: Family Medicine

## 2022-02-28 ENCOUNTER — Other Ambulatory Visit (HOSPITAL_COMMUNITY)
Admission: RE | Admit: 2022-02-28 | Discharge: 2022-02-28 | Disposition: A | Discharge status: Home / Self Care | Payer: BC Managed Care – PPO | Source: Ambulatory Visit | Attending: Family Medicine | Admitting: Family Medicine

## 2022-02-28 VITALS — BP 107/71 | HR 87 | Temp 97.5°F | Ht 67.25 in | Wt 119.6 lb

## 2022-02-28 DIAGNOSIS — F320 Major depressive disorder, single episode, mild: Secondary | ICD-10-CM

## 2022-02-28 DIAGNOSIS — R369 Urethral discharge, unspecified: Secondary | ICD-10-CM

## 2022-02-28 DIAGNOSIS — F419 Anxiety disorder, unspecified: Secondary | ICD-10-CM | POA: Diagnosis not present

## 2022-02-28 LAB — COMPREHENSIVE METABOLIC PANEL
ALT: 8 U/L (ref 0–53)
AST: 14 U/L (ref 0–37)
Albumin: 4.5 g/dL (ref 3.5–5.2)
Alkaline Phosphatase: 55 U/L (ref 39–117)
BUN: 12 mg/dL (ref 6–23)
CO2: 30 mEq/L (ref 19–32)
Calcium: 9.6 mg/dL (ref 8.4–10.5)
Chloride: 104 mEq/L (ref 96–112)
Creatinine, Ser: 1.01 mg/dL (ref 0.40–1.50)
GFR: 103.16 mL/min (ref >60.00)
Glucose, Bld: 79 mg/dL (ref 70–99)
Potassium: 4.2 mEq/L (ref 3.5–5.1)
Sodium: 139 mEq/L (ref 135–145)
Total Bilirubin: 0.8 mg/dL (ref 0.2–1.2)
Total Protein: 7.2 g/dL (ref 6.0–8.3)

## 2022-02-28 LAB — CBC
HCT: 43.3 % (ref 39.0–52.0)
Hemoglobin: 14.3 g/dL (ref 13.0–17.0)
MCHC: 33.1 g/dL (ref 30.0–36.0)
MCV: 85.4 fl (ref 78.0–100.0)
Platelets: 217 10*3/uL (ref 150.0–400.0)
RBC: 5.07 Mil/uL (ref 4.22–5.81)
RDW: 13.3 % (ref 11.5–15.5)
WBC: 4.2 10*3/uL (ref 4.0–10.5)

## 2022-02-28 LAB — TSH: TSH: 0.61 u[IU]/mL (ref 0.35–5.50)

## 2022-02-28 MED ORDER — CITALOPRAM HYDROBROMIDE 20 MG PO TABS: 20.0000 mg | ORAL_TABLET | Freq: Every day | ORAL | 3 refills | Status: DC

## 2022-02-28 NOTE — Assessment & Plan Note (Addendum)
Mildly elevated PHQ-9 score today.  No SI or HI. He has been on Celexa for several months.  He is interested in restarting.  We will send in prescription today.  Tolerated well in the past without any significant side effects.  He will follow-up in a couple weeks via MyChart.

## 2022-02-28 NOTE — Assessment & Plan Note (Signed)
He has previously been on Celexa however stopped several months ago.  Anxiety has been worsening a little bit.  We will be restarting his Celexa as above.  He will follow-up in a few weeks via MyChart and we can titrate the dose as needed.

## 2022-02-28 NOTE — Progress Notes (Signed)
   Robert Hayden is a 25 y.o. male who presents today for an office visit.  Assessment/Plan:  New/Acute Problems: Penile Discharge Symptoms have resolved.  We will recheck urine cytology for gonorrhea chlamydia.  Also check HIV and RPR.  Encouraged safe sex practices - he now has single partner.  Chronic Problems Addressed Today: Depression, major, single episode, mild (HCC) Mildly elevated PHQ-9 score today.  No SI or HI. He has been on Celexa for several months.  He is interested in restarting.  We will send in prescription today.  Tolerated well in the past without any significant side effects.  He will follow-up in a couple weeks via MyChart.  Anxiety He has previously been on Celexa however stopped several months ago.  Anxiety has been worsening a little bit.  We will be restarting his Celexa as above.  He will follow-up in a few weeks via MyChart and we can titrate the dose as needed.     Subjective:  HPI:  Patient here with concern for STD  He had has several male sexual partners over the last 4-6 weeks. Did not use protection. He started developing some penile discharge. [facility] for gonorrhea and chlamydia. He was treated with antibiotics a couple of weeks ago. Discharge cleared up but he is still having some lower abdominal pain. Some nausea. No vomiting. No swelling lymph nodes. No fevers or chills.        Objective:  Physical Exam: BP 107/71   Pulse 87   Temp (!) 97.5 F (36.4 C) (Temporal)   Ht 5' 7.25" (1.708 m)   Wt 119 lb 9.6 oz (54.3 kg)   SpO2 98%   BMI 18.59 kg/m   Gen: No acute distress, resting comfortably CV: Regular rate and rhythm with no murmurs appreciated Pulm: Normal work of breathing, clear to auscultation bilaterally with no crackles, wheezes, or rhonchi Neuro: Grossly normal, moves all extremities Psych: Normal affect and thought content      Robert Hayden M. Jimmey Ralph, MD 02/28/2022 12:16 PM

## 2022-02-28 NOTE — Patient Instructions (Signed)
It was very nice to see you today!  We will check blood work and a urine sample today.  We will restart your Celexa.  Please send a message in a few weeks to let me know how you are doing.  Take care, Dr Jimmey Ralph  PLEASE NOTE:  If you had any lab tests please let us know if you have not heard back within a few days. You may see your results on mychart before we have a chance to review them but we will give you a call once they are reviewed by [place]. If we ordered any referrals today, please let us know if you have not heard from their office within the next week.   Please try these tips to maintain a healthy lifestyle:  Eat at least 3 REAL meals and 1-2 snacks per day.  Aim for no more than 5 hours between eating.  If you eat breakfast, please do so within one hour of getting up.   Each meal should contain half fruits/vegetables, one quarter protein, and one quarter carbs (no bigger than a computer mouse)  Cut down on sweet beverages. This includes juice, soda, and sweet tea.   Drink at least 1 glass of water with each meal and aim for at least 8 glasses per day  Exercise at least 150 minutes every week.

## 2022-03-01 LAB — URINE CYTOLOGY ANCILLARY ONLY
Chlamydia: NEGATIVE
Comment: NEGATIVE
Comment: NORMAL
Neisseria Gonorrhea: NEGATIVE

## 2022-03-01 LAB — RPR: RPR Ser Ql: NONREACTIVE

## 2022-03-01 LAB — HIV ANTIBODY (ROUTINE TESTING W REFLEX): HIV 1&2 Ab, 4th Generation: NONREACTIVE

## 2022-03-04 ENCOUNTER — Ambulatory Visit: Payer: BC Managed Care – PPO | Admitting: Family Medicine

## 2022-03-05 NOTE — Progress Notes (Signed)
Please inform patient of the following:  Great news! Labs are all NORMAL. No STDs.  Katina Degree. Jimmey Ralph, MD 03/05/2022 8:11 AM

## 2022-10-09 ENCOUNTER — Encounter: Payer: Self-pay | Admitting: Family Medicine

## 2022-10-09 ENCOUNTER — Other Ambulatory Visit (HOSPITAL_COMMUNITY)
Admission: RE | Admit: 2022-10-09 | Discharge: 2022-10-09 | Disposition: A | Discharge status: Home / Self Care | Payer: Self-pay | Source: Ambulatory Visit | Attending: Physician Assistant | Admitting: Physician Assistant

## 2022-10-09 ENCOUNTER — Ambulatory Visit (INDEPENDENT_AMBULATORY_CARE_PROVIDER_SITE_OTHER): Payer: Self-pay | Admitting: Family Medicine

## 2022-10-09 VITALS — BP 103/69 | HR 62 | Temp 98.0°F | Ht 67.0 in | Wt 122.0 lb

## 2022-10-09 DIAGNOSIS — Z113 Encounter for screening for infections with a predominantly sexual mode of transmission: Secondary | ICD-10-CM | POA: Insufficient documentation

## 2022-10-09 DIAGNOSIS — L910 Hypertrophic scar: Secondary | ICD-10-CM | POA: Insufficient documentation

## 2022-10-09 DIAGNOSIS — F320 Major depressive disorder, single episode, mild: Secondary | ICD-10-CM

## 2022-10-09 DIAGNOSIS — K529 Noninfective gastroenteritis and colitis, unspecified: Secondary | ICD-10-CM

## 2022-10-09 NOTE — Patient Instructions (Signed)
It was very nice to see you today!  I think you had a GI bug that resolved.  Let us know if you have any recurrence of symptoms.  Will check blood work and a urine sample today.  You have a keloid on your leg.  This is benign.  Let us know if you would like to try any treatments for this.  Return if symptoms worsen or fail to improve.   Take care, Dr Jimmey Ralph  PLEASE NOTE:  If you had any lab tests, please let us know if you have not heard back within a few days. You may see your results on mychart before we have a chance to review them but we will give you a call once they are reviewed by [place].   If we ordered any referrals today, please let us know if you have not heard from their office within the next week.   If you had any urgent prescriptions sent in today, please check with the pharmacy within an hour of our visit to make sure the prescription was transmitted appropriately.   Please try these tips to maintain a healthy lifestyle:  Eat at least 3 REAL meals and 1-2 snacks per day.  Aim for no more than 5 hours between eating.  If you eat breakfast, please do so within one hour of getting up.   Each meal should contain half fruits/vegetables, one quarter protein, and one quarter carbs (no bigger than a computer mouse)  Cut down on sweet beverages. This includes juice, soda, and sweet tea.   Drink at least 1 glass of water with each meal and aim for at least 8 glasses per day  Exercise at least 150 minutes every week.

## 2022-10-09 NOTE — Progress Notes (Signed)
   Robert Hayden is a 27 y.o. male who presents today for an office visit.  Assessment/Plan:  New/Acute Problems: Gastroenteritis No red flags.  Symptoms have resolved.  Likely had a mild viral illness.  Encouraged hydration.  He will let us know if he has any recurrence.  STD screening Check labs.  Encouraged safe sex practices.  Chronic Problems Addressed Today: Keloid Reassured patient.  We did discuss over-the-counter options for keloid treatment.  He will let us know if he wishes to try intralesional steroid injections or referral to plastic surgery or dermatology.  Depression, major, single episode, mild (HCC) Stable on Celexa 20 mg daily.     Subjective:  HPI:  See Assessment / plan for status of chronic conditions.  Main concern today is an episode of abdominal pain, nausea, and diarrhea.  This started 5 days ago.  Lasted for a few days and then resolved.  No vomiting.  No melena or hematochezia.  No sick contacts.  No fevers or chills.  He has not had any recurrence over the last couple of days  He does admit to having unprotected intercourse with 3 different partners over the last few months and request to have STD testing today.  Not currently having any symptoms.  Is also noticed a dark area to the back of his right leg.  He thinks this has been there for several years.  May have started after getting a cut or scrape while playing basketball.        Objective:  Physical Exam: BP 103/69   Pulse 62   Temp 98 F (36.7 C) (Temporal)   Ht 5\' 7"  (1.702 m)   Wt 122 lb (55.3 kg)   SpO2 99%   BMI 19.11 kg/m   .lastw3  Gen: No acute distress, resting comfortably CV: Regular rate and rhythm with no murmurs appreciated Pulm: Normal work of breathing, clear to auscultation bilaterally with no crackles, wheezes, or rhonchi Skin: Hypertrophic scars approximately 1 x 3 cm and 2 x 0.5cm on posterior right calf Neuro: Grossly normal, moves all extremities Psych: Normal  affect and thought content      Robert Dinneen M. Jimmey Ralph, MD 10/09/2022 9:10 AM

## 2022-10-09 NOTE — Assessment & Plan Note (Signed)
Stable on Celexa 20 mg daily.

## 2022-10-09 NOTE — Assessment & Plan Note (Signed)
Reassured patient.  We did discuss over-the-counter options for keloid treatment.  He will let us know if he wishes to try intralesional steroid injections or referral to plastic surgery or dermatology.

## 2022-10-10 LAB — HIV ANTIBODY (ROUTINE TESTING W REFLEX): HIV 1&2 Ab, 4th Generation: NONREACTIVE

## 2022-10-10 LAB — URINE CYTOLOGY ANCILLARY ONLY
Chlamydia: NEGATIVE
Comment: NEGATIVE
Comment: NORMAL
Neisseria Gonorrhea: NEGATIVE

## 2022-10-10 LAB — RPR: RPR Ser Ql: NONREACTIVE

## 2022-10-14 NOTE — Progress Notes (Signed)
Great news! STD testing is all NORMAL.  He does not have any STDs.

## 2023-03-04 ENCOUNTER — Telehealth: Payer: Self-pay | Admitting: Family Medicine

## 2023-03-04 NOTE — Telephone Encounter (Signed)
Patient is scheduled for appointment with Dr. Jimmey Ralph 03/05/23 at 10:00 am  Patient Advised: See PCP Within 24 Hours  Patient Name First: Robert Last: Hayden Gender: Male DOB: 1995/12/06 Age: 27 Y 9 M 20 D Return Phone Number: [phone] (Primary) Address: City/ State/ Zip: [place]  [address] Client [facility] - [facility], [facility] Provider Jacquiline Doe- MD Contact Type Call Who Is Calling Patient / Member / Family / Caregiver Call Type Triage / Clinical Relationship To Patient Self Return Phone Number [phone] (Primary) Chief Complaint Dizziness Reason for Call Symptomatic / Request for Health Information Initial Comment Caller states they have dizziness, blurred vision and lightheadedness. Translation No Nurse Assessment Nurse: Lily Kocher, RN, Adriana Date/Time (Eastern Time): 03/04/2023 11:08:11 AM Confirm and document reason for call. If symptomatic, describe symptoms. ---pt reports dizziness on saturday and sunday. once each day. Does the patient have any new or worsening symptoms? ---Yes Will a triage be completed? ---Yes Related visit to physician within the last 2 weeks? ---N/A Does the PT have any chronic conditions? (i.e. diabetes, asthma, this includes High risk factors for pregnancy, etc.) ---No Is this a behavioral health or substance abuse call? ---No Guidelines Guideline Title Affirmed Question Affirmed Notes Nurse Date/Time (Eastern Time) Dizziness - Vertigo [1] MODERATE dizziness (e.g., vertigo; feels very unsteady, interferes with normal activities) AND [2] has NOT been evaluated by doctor (or NP/PA) for this Lily Kocher, Charity fundraiser, Adriana 03/04/2023 11:10:11 AM Disp. Time Lamount Cohen Time) Disposition Final User 03/04/2023 10:56:54 AM Attempt made - message left York Cerise 03/04/2023 10:59:52 AM Send To Nurse Ferman Hamming, RN, Ethan 03/04/2023 11:00:31 AM Send To  RN Personal Noelle Penner, RN, Ethan 03/04/2023 11:12:50 AM See PCP within 24 Hours Yes Lily Kocher RN, Adriana Final Disposition 03/04/2023 11:12:50 AM See PCP within 24 Hours Yes Lily Kocher, RN, Carlton Adam Disagree/Comply Comply Caller Understands Yes PreDisposition Call Doctor Care Advice Given Per Guideline SEE PCP WITHIN 24 HOURS: * IF OFFICE WILL BE OPEN: You need to be examined within the next 24 hours. Call your doctor (or NP/PA) when the office opens and make an appointment. CALL BACK IF: * Severe headache occurs * Weakness develops in an arm or leg * Unable to walk without falling * You become worse CARE ADVICE given per Dizziness - Vertigo (Adult) guideline Referrals REFERRED TO PCP OFFICE

## 2023-03-04 NOTE — Telephone Encounter (Signed)
See note

## 2023-03-04 NOTE — Telephone Encounter (Signed)
FYI: This call has been transferred to triage nurse: the Triage Nurse. Once the result note has been entered staff can address the message at that time.  Patient called in with the following symptoms:  Red Word:dizziness , blurred vision, light headed (for approx. 3 days)   Please advise at Mobile [phone] (mobile)  Message is routed to Provider Pool.

## 2023-03-05 ENCOUNTER — Encounter: Payer: Self-pay | Admitting: Family Medicine

## 2023-03-05 ENCOUNTER — Other Ambulatory Visit (HOSPITAL_COMMUNITY)
Admission: RE | Admit: 2023-03-05 | Discharge: 2023-03-05 | Disposition: A | Discharge status: Home / Self Care | Payer: Self-pay | Source: Ambulatory Visit | Attending: Family Medicine | Admitting: Family Medicine

## 2023-03-05 ENCOUNTER — Ambulatory Visit (INDEPENDENT_AMBULATORY_CARE_PROVIDER_SITE_OTHER): Payer: Self-pay | Admitting: Family Medicine

## 2023-03-05 VITALS — BP 100/75 | HR 75 | Temp 98.0°F | Ht 67.0 in | Wt 118.4 lb

## 2023-03-05 DIAGNOSIS — R5383 Other fatigue: Secondary | ICD-10-CM

## 2023-03-05 DIAGNOSIS — Z113 Encounter for screening for infections with a predominantly sexual mode of transmission: Secondary | ICD-10-CM | POA: Insufficient documentation

## 2023-03-05 DIAGNOSIS — Z114 Encounter for screening for human immunodeficiency virus [HIV]: Secondary | ICD-10-CM

## 2023-03-05 DIAGNOSIS — F419 Anxiety disorder, unspecified: Secondary | ICD-10-CM

## 2023-03-05 DIAGNOSIS — F320 Major depressive disorder, single episode, mild: Secondary | ICD-10-CM

## 2023-03-05 LAB — CBC
HCT: 46.9 % (ref 39.0–52.0)
Hemoglobin: 15.7 g/dL (ref 13.0–17.0)
MCHC: 33.5 g/dL (ref 30.0–36.0)
MCV: 85.5 fL (ref 78.0–100.0)
Platelets: 225 10*3/uL (ref 150.0–400.0)
RBC: 5.49 Mil/uL (ref 4.22–5.81)
RDW: 13.6 % (ref 11.5–15.5)
WBC: 6.4 10*3/uL (ref 4.0–10.5)

## 2023-03-05 LAB — COMPREHENSIVE METABOLIC PANEL
ALT: 9 U/L (ref 0–53)
AST: 15 U/L (ref 0–37)
Albumin: 4.5 g/dL (ref 3.5–5.2)
Alkaline Phosphatase: 50 U/L (ref 39–117)
BUN: 9 mg/dL (ref 6–23)
CO2: 29 meq/L (ref 19–32)
Calcium: 9.8 mg/dL (ref 8.4–10.5)
Chloride: 104 meq/L (ref 96–112)
Creatinine, Ser: 1.08 mg/dL (ref 0.40–1.50)
GFR: 94.51 mL/min (ref >60.00)
Glucose, Bld: 86 mg/dL (ref 70–99)
Potassium: 3.9 meq/L (ref 3.5–5.1)
Sodium: 140 meq/L (ref 135–145)
Total Bilirubin: 0.5 mg/dL (ref 0.2–1.2)
Total Protein: 7.6 g/dL (ref 6.0–8.3)

## 2023-03-05 LAB — VITAMIN B12: Vitamin B-12: 458 pg/mL (ref 211–911)

## 2023-03-05 LAB — TSH: TSH: 0.52 u[IU]/mL (ref 0.35–5.50)

## 2023-03-05 MED ORDER — ESCITALOPRAM OXALATE 10 MG PO TABS: 10.0000 mg | ORAL_TABLET | Freq: Every day | ORAL | 3 refills | Status: AC

## 2023-03-05 NOTE — Assessment & Plan Note (Signed)
Symptoms are not controlled.  PHQ score 17 today.  Likely main contributing for his above fatigue.  We discussed treatment options.  He is agreeable to go back on SSRI.  Previously did well with Celexa though felt like it could have been stronger.  He is interested in trying different medication.  Will try Lexapro 10 mg daily.  We discussed potential side effects.  He will follow-up with [place] in 1 to 2 weeks via MyChart and we can adjust meds as needed.

## 2023-03-05 NOTE — Progress Notes (Signed)
Robert Hayden is a 27 y.o. male who presents today for an office visit.  Assessment/Plan:  New/Acute Problems: Other Fatigue Likely secondary to inadequately treated depression.  Reassuring exam today.  No other red flag signs or symptoms.  No SI or HI.  We will check labs to look for other causes including CBC, c-Met, and TSH.  We treating his depression as below.  He will follow-up with [place] in a couple of weeks.  Rash This is now healing.  Most consistent with folliculitis though it may have been HSV outbreak.  Will check HSV antibody today with labs.  He will let us know if he has any recurrence.  Chronic Problems Addressed Today: Depression, major, single episode, mild (HCC) Symptoms are not controlled.  PHQ score 17 today.  Likely main contributing for his above fatigue.  We discussed treatment options.  He is agreeable to go back on SSRI.  Previously did well with Celexa though felt like it could have been stronger.  He is interested in trying different medication.  Will try Lexapro 10 mg daily.  We discussed potential side effects.  He will follow-up with [place] in 1 to 2 weeks via MyChart and we can adjust meds as needed.  Anxiety See depression A/P.  Will be starting Lexapro 10 mg daily.  He will follow-up with [place] in a couple of weeks via MyChart.  Preventative Healthcare Screen for STDs today per patient request.    Subjective:  HPI:  See Assessment / plan for status of chronic conditions.  Patient here with fatigue.  Symptoms started about 5 days ago.  Patient did have an episode 5 days ago where he had a several minutes of feeling fatigued, drowsy, lightheaded, blurred vision, and nausea use.  He rested for a while and symptoms improved however since then he has still had issues with being fatigued.  He does admit to being more down and low energy recently and thinks that he is depressed.  He has had issues with depression in the past and has been treated with Celexa.  He has been  off of Celexa over the last 6 months or so as he felt like he was doing well without it.  He has been under more stress at work as well and has been out of work for the last couple of weeks.  He recently got a promotion and will be starting this soon.  He has not had any recent illnesses.  No fevers or chills.  No chest pain.  He also has noticed a few bumps on his penis.  This started a couple of weeks ago.  Initially was painful and then as resolved since then.  He would like to be checked for STDs today.     03/05/2023   10:16 AM 10/09/2022    8:42 AM 02/28/2022    1:13 PM  Depression screen PHQ 2/9  Decreased Interest 3 0 1  Down, Depressed, Hopeless 3  2  PHQ - 2 Score 6 0 3  Altered sleeping 3 0 0  Tired, decreased energy 3 0 3  Change in appetite 3 0 3  Feeling bad or failure about yourself  2 0 2  Trouble concentrating 0 0 1  Moving slowly or fidgety/restless 0  1  Suicidal thoughts 0 0 0  PHQ-9 Score 17 0 13  Difficult doing work/chores Somewhat difficult Not difficult at all Not difficult at all          Objective:  Physical Exam: BP 100/75   Pulse 75   Temp 98 F (36.7 C) (Temporal)   Ht 5\' 7"  (1.702 m)   Wt 118 lb 6.4 oz (53.7 kg)   SpO2 100%   BMI 18.54 kg/m   Gen: No acute distress, resting comfortably CV: Regular rate and rhythm with no murmurs appreciated Pulm: Normal work of breathing, clear to auscultation bilaterally with no crackles, wheezes, or rhonchi GU: Normal male genitalia.  A few scattered inflamed hair follicles noted on left side of penile shaft.  No obvious vesicles. Neuro: Grossly normal, moves all extremities Psych: Normal affect and thought content      Tabby Beaston M. Jimmey Ralph, MD 03/05/2023 10:51 AM

## 2023-03-05 NOTE — Patient Instructions (Signed)
It was very nice to see you today!  Your exam today is normal.  I think that your symptoms are probably coming mostly from your depression and anxiety.  Please start the Lexapro.  Send me a message in a few weeks to let me know how you are doing with this.  We will check blood work today.  Return in about 2 weeks (around 03/19/2023).   Take care, Dr Jimmey Ralph  PLEASE NOTE:  If you had any lab tests, please let us know if you have not heard back within a few days. You may see your results on mychart before we have a chance to review them but we will give you a call once they are reviewed by [place].   If we ordered any referrals today, please let us know if you have not heard from their office within the next week.   If you had any urgent prescriptions sent in today, please check with the pharmacy within an hour of our visit to make sure the prescription was transmitted appropriately.   Please try these tips to maintain a healthy lifestyle:  Eat at least 3 REAL meals and 1-2 snacks per day.  Aim for no more than 5 hours between eating.  If you eat breakfast, please do so within one hour of getting up.   Each meal should contain half fruits/vegetables, one quarter protein, and one quarter carbs (no bigger than a computer mouse)  Cut down on sweet beverages. This includes juice, soda, and sweet tea.   Drink at least 1 glass of water with each meal and aim for at least 8 glasses per day  Exercise at least 150 minutes every week.

## 2023-03-05 NOTE — Assessment & Plan Note (Signed)
See depression A/P.  Will be starting Lexapro 10 mg daily.  He will follow-up with [place] in a couple of weeks via MyChart.

## 2023-03-06 LAB — URINE CYTOLOGY ANCILLARY ONLY
Chlamydia: NEGATIVE
Comment: NEGATIVE
Comment: NORMAL
Neisseria Gonorrhea: NEGATIVE

## 2023-03-07 LAB — HSV(HERPES SIMPLEX VRS) I + II AB-IGG
HSV 1 IGG,TYPE SPECIFIC AB: 1.38 {index} — ABNORMAL HIGH
HSV 2 IGG,TYPE SPECIFIC AB: <0.9 {index}

## 2023-03-07 LAB — HIV ANTIBODY (ROUTINE TESTING W REFLEX): HIV 1&2 Ab, 4th Generation: NONREACTIVE

## 2023-03-07 LAB — RPR: RPR Ser Ql: NONREACTIVE

## 2023-03-10 NOTE — Progress Notes (Signed)
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  STD testing is negative.

## 2023-09-24 ENCOUNTER — Ambulatory Visit: Payer: Self-pay | Admitting: Family Medicine

## 2023-10-15 ENCOUNTER — Ambulatory Visit (INDEPENDENT_AMBULATORY_CARE_PROVIDER_SITE_OTHER): Payer: Self-pay | Admitting: Family Medicine

## 2023-10-15 ENCOUNTER — Encounter: Payer: Self-pay | Admitting: Family Medicine

## 2023-10-15 ENCOUNTER — Other Ambulatory Visit (HOSPITAL_COMMUNITY)
Admission: RE | Admit: 2023-10-15 | Discharge: 2023-10-15 | Disposition: A | Discharge status: Home / Self Care | Payer: Self-pay | Source: Ambulatory Visit | Attending: Family Medicine | Admitting: Family Medicine

## 2023-10-15 VITALS — BP 99/63 | HR 58 | Temp 98.1°F | Ht 67.0 in | Wt 118.4 lb

## 2023-10-15 DIAGNOSIS — L910 Hypertrophic scar: Secondary | ICD-10-CM

## 2023-10-15 DIAGNOSIS — Z113 Encounter for screening for infections with a predominantly sexual mode of transmission: Secondary | ICD-10-CM | POA: Insufficient documentation

## 2023-10-15 DIAGNOSIS — F419 Anxiety disorder, unspecified: Secondary | ICD-10-CM

## 2023-10-15 DIAGNOSIS — F320 Major depressive disorder, single episode, mild: Secondary | ICD-10-CM

## 2023-10-15 NOTE — Patient Instructions (Signed)
 It was very nice to see you today!  I will refer you to the plastic surgeon to discuss management for your keloid.  Will check for STDs today.  Return if symptoms worsen or fail to improve.   Take care, Dr Kennyth  PLEASE NOTE:  If you had any lab tests, please let us  know if you have not heard back within a few days. You may see your results on mychart before we have a chance to review them but we will give you a call once they are reviewed by us .   If we ordered any referrals today, please let us  know if you have not heard from their office within the next week.   If you had any urgent prescriptions sent in today, please check with the pharmacy within an hour of our visit to make sure the prescription was transmitted appropriately.   Please try these tips to maintain a healthy lifestyle:  Eat at least 3 REAL meals and 1-2 snacks per day.  Aim for no more than 5 hours between eating.  If you eat breakfast, please do so within one hour of getting up.   Each meal should contain half fruits/vegetables, one quarter protein, and one quarter carbs (no bigger than a computer mouse)  Cut down on sweet beverages. This includes juice, soda, and sweet tea.   Drink at least 1 glass of water with each meal and aim for at least 8 glasses per day  Exercise at least 150 minutes every week.

## 2023-10-15 NOTE — Progress Notes (Signed)
   Robert Hayden is a 28 y.o. male who presents today for an office visit.  Assessment/Plan:  New/Acute Problems: STD Screen  No current symptoms.  Check urine cytology per patient request.  Chronic Problems Addressed Today: Keloid He has not had any improvement with over-the-counter keloid treatments.  We did discuss trial of high potency topical steroids versus intralesional injection versus referral to plastic surgery.  He would like to see a Engineer, petroleum.  Will place referral today.  Depression, major, single episode, mild (HCC) Stable on Lexapro  10 mg daily.  Anxiety Stable on Lexapro  10 mg daily.     Subjective:  HPI:  See Assessment / plan for status of chronic conditions.  Patient here today to discuss keloid management.  Located on posterior right leg.  This has been present for several years.  Seems to be getting larger lately.  Having some pain and itching in the area.  He has been trying to use over-the-counter kibble treatment for the last few months without any improvement.       Objective:  Physical Exam: BP 99/63   Pulse (!) 58   Temp 98.1 F (36.7 C) (Temporal)   Ht 5' 7 (1.702 m)   Wt 118 lb 6.4 oz (53.7 kg)   SpO2 100%   BMI 18.54 kg/m   Gen: No acute distress, resting comfortably Skin: 2 discrete keloids on right posterior calf. See below photo.  Neuro: Grossly normal, moves all extremities Psych: Normal affect and thought content        Robert Stash M. Kennyth, MD 10/15/2023 10:00 AM

## 2023-10-15 NOTE — Assessment & Plan Note (Signed)
 Stable on Lexapro 10 mg daily.

## 2023-10-15 NOTE — Assessment & Plan Note (Signed)
 He has not had any improvement with over-the-counter keloid treatments.  We did discuss trial of high potency topical steroids versus intralesional injection versus referral to plastic surgery.  He would like to see a Engineer, petroleum.  Will place referral today.

## 2023-10-16 LAB — URINE CYTOLOGY ANCILLARY ONLY
Chlamydia: NEGATIVE
Comment: NEGATIVE
Comment: NEGATIVE
Comment: NORMAL
Neisseria Gonorrhea: NEGATIVE
Trichomonas: NEGATIVE

## 2023-10-17 ENCOUNTER — Ambulatory Visit: Payer: Self-pay | Admitting: Family Medicine

## 2023-10-17 NOTE — Progress Notes (Signed)
 STD test is negative

## 2023-11-26 ENCOUNTER — Encounter: Payer: Self-pay | Admitting: Plastic Surgery

## 2023-11-26 ENCOUNTER — Ambulatory Visit: Payer: Self-pay | Admitting: Plastic Surgery

## 2023-11-26 VITALS — BP 113/68 | HR 64 | Ht 67.0 in | Wt 119.2 lb

## 2023-11-26 DIAGNOSIS — L905 Scar conditions and fibrosis of skin: Secondary | ICD-10-CM

## 2023-11-26 NOTE — Progress Notes (Signed)
   Referring Provider Kennyth Worth HERO, MD [address] [place],  [place] [address]   CC:  Chief Complaint  Patient presents with   Consult      Robert Hayden is an 28 y.o. male.  HPI: Robert Hayden is a 28 year old male who presents today with 2 scars on the posterior aspect of the right leg.  These have been there for many years and he is unsure exactly how they occurred.  He does note though that they are tender have a significant amount of itching.  He is interested in treatment.  No Known Allergies  Outpatient Encounter Medications as of 11/26/2023  Medication Sig   escitalopram  (LEXAPRO ) 10 MG tablet Take 1 tablet (10 mg total) by mouth daily.   No facility-administered encounter medications on file as of 11/26/2023.     No past medical history on file.  No past surgical history on file.  No family history on file.  Social History   Social History Narrative   Not on file     Review of Systems General: Denies fevers, chills, weight loss CV: Denies chest pain, shortness of breath, palpitations Skin: 2 parallel scars on the posterior leg which itch and are painful  Physical Exam    11/26/2023   10:20 AM 10/15/2023    9:14 AM 03/05/2023   10:13 AM  Vitals with BMI  Height 5' 7 5' 7 5' 7  Weight 119 lbs 3 oz 118 lbs 6 oz 118 lbs 6 oz  BMI 18.67 18.54 18.54  Systolic 113 99 100  Diastolic 68 63 75  Pulse 64 58 75    General:  No acute distress,  Alert and oriented, Non-Toxic, Normal speech and affect Skin: As noted above patient has 2 lesions on the posterior aspect of the right leg 1 approximately 3 cm in length and the other 1 approximately 4 cm in length both approximately 1 cm in width.  They are parallel to each other.   Assessment/Plan Scars, posterior right leg: Scars appear to be hypertrophic scars possibly with some component of keloid to them.  Regardless I believe the initial treatment should be with injection of the scars with Kenalog.  I discussed this  with him at length.  We also discussed the possibility of skin thinning and hypopigmentation with injection of the scars.  If the injection does not give him the desired relief he may need excision of the scars.  Because of the parallel nature this will need to be done 1 scar at a time and allow the wound to heal before treating the second scar.  He is in agreement with all of this.  He has requested that I inject the scar today.  Procedure: After cleansing the skin with alcohol of the lesions were injected with 1 mL of a 50-50 mixture of Kenalog 40 and 1% plain lidocaine.  Patient tolerated the procedure well and there were no complications.  I have asked him to begin massaging the scar daily.  He will return to see me in 4 to 6 weeks.  Robert Hayden 11/26/2023, 10:56 AM

## 2023-12-31 ENCOUNTER — Ambulatory Visit: Payer: Self-pay | Admitting: Plastic Surgery

## 2024-01-21 ENCOUNTER — Ambulatory Visit (INDEPENDENT_AMBULATORY_CARE_PROVIDER_SITE_OTHER): Payer: Self-pay | Admitting: Plastic Surgery

## 2024-01-21 ENCOUNTER — Encounter: Payer: Self-pay | Admitting: Plastic Surgery

## 2024-01-21 VITALS — BP 118/72 | HR 66 | Ht 68.0 in | Wt 120.0 lb

## 2024-01-21 DIAGNOSIS — L905 Scar conditions and fibrosis of skin: Secondary | ICD-10-CM

## 2024-01-21 NOTE — Progress Notes (Signed)
 Mr. Pasternak returns today for evaluation of the scars on the posterior aspect of the right leg.  These were injected with steroids approximately 6 weeks ago.  He noted flattening of one of the scars but the second scar still remains raised and somewhat uncomfortable.  We discussed options for management today.  Again we discussed excision this will need to be done 1 scar at a time.  We also discussed injection of the scars with steroids and continued massage.  He elected to undergo injection again today.  If he does not note improvement over the next 3 to 4 weeks he will call back and we will just plan to excise the scar.  He does not need to make an appointment for evaluation prior to the excision.  If he does note that the scar is improving with the steroids this time he will return in 4 to 6 weeks to have the scar evaluated and possibly injected 1 last time.  Procedure  After obtaining verbal consent and prepping the skin the scars were injected with 0.5 mL each of a 50-50 mixture of Kenalog 40 and 1% plain lidocaine.  There were no complications.  He tolerated the procedure well.  Dressings were applied and wound care instructions discussed.

## 2024-03-03 ENCOUNTER — Ambulatory Visit (INDEPENDENT_AMBULATORY_CARE_PROVIDER_SITE_OTHER): Payer: Self-pay | Admitting: Plastic Surgery

## 2024-03-03 ENCOUNTER — Encounter: Payer: Self-pay | Admitting: Plastic Surgery

## 2024-03-03 VITALS — BP 123/77 | HR 72 | Ht 68.0 in | Wt 122.8 lb

## 2024-03-03 DIAGNOSIS — L910 Hypertrophic scar: Secondary | ICD-10-CM

## 2024-03-03 DIAGNOSIS — L905 Scar conditions and fibrosis of skin: Secondary | ICD-10-CM

## 2024-03-03 NOTE — Progress Notes (Signed)
 Mr. Coleman returns today for evaluation and reinjection of the hypertrophic scar on the posterior aspect of his right leg.  He states that one of the scars is now completely flat and the other is much improved.  The pain and itching is better but not completely gone.  He is requesting another injection.  Procedure- injection of hypertrophic scar with steroids.  We discussed the possibility of skin thinning and hypopigmentation.  After cleaning the skin with alcohol the scar was injected with 0.5 mL of a 50-50 mixture of Kenalog 40 and 1% plain lidocaine.  There were no complications.  Bandages were applied.  We again discussed the importance of massage.  He will return to see me in 4 to 6 weeks.

## 2024-04-14 ENCOUNTER — Ambulatory Visit: Payer: Self-pay | Admitting: Plastic Surgery

## 2024-04-14 DIAGNOSIS — L910 Hypertrophic scar: Secondary | ICD-10-CM

## 2024-04-14 NOTE — Progress Notes (Signed)
 Mr. Arquette returns today for evaluation of the keloids on the posterior aspect of his right leg.  He notes minimal improvement on the inferior aspect of the lateral scar after injection last time.  We have elected to do 1 more injection today.  Procedure: After obtaining consent the wound was prepped with alcohol and the inferior portion of the scar was infiltrated with 0.2 mL of a 50-50 mixture of Kenalog 40 and 1% plain lidocaine.  There were no complications  We discussed continued scar massage and I also encouraged him to begin using scar tape on the wounds.  He will return to see me in 4 to 6 weeks.

## 2024-05-12 ENCOUNTER — Ambulatory Visit: Payer: Self-pay | Admitting: Plastic Surgery

## 2024-05-19 ENCOUNTER — Ambulatory Visit: Payer: Self-pay | Admitting: Plastic Surgery

## 2024-05-19 VITALS — BP 123/69

## 2024-05-19 DIAGNOSIS — L905 Scar conditions and fibrosis of skin: Secondary | ICD-10-CM

## 2024-05-19 NOTE — Progress Notes (Signed)
 Robert Hayden returns today for evaluation of the keloids on the back of his right calf.  These are now flat and no longer cause him pain or itching.  We discussed using silicone strips over the scars.  I believe further steroid injections would be detrimental.  He will let me know if the scars thickening or become uncomfortable in any way.  Follow-up as needed
